# Patient Record
Sex: Male | Born: 1945
Health system: Southern US, Community
[De-identification: ages and names within clinical notes are randomized; demographics above are authoritative.]

---

## 2006-01-04 ENCOUNTER — Ambulatory Visit (HOSPITAL_COMMUNITY): Admission: RE | Admit: 2006-01-04 | Discharge: 2006-01-04 | Payer: Self-pay | Admitting: Neurosurgery

## 2006-02-01 ENCOUNTER — Ambulatory Visit (HOSPITAL_COMMUNITY): Admission: RE | Admit: 2006-02-01 | Discharge: 2006-02-01 | Payer: Self-pay | Admitting: Cardiology

## 2007-12-04 IMAGING — CR DG LUMBAR SPINE 2-3V
2 series · 2 of 2 positions shown · non-contrast
Comparison: None.

CLINICAL DATA: L4-5 microdiscectomy.   
 LUMBAR SPINE - 2 VIEW:

[view not recorded (1 of 2)]
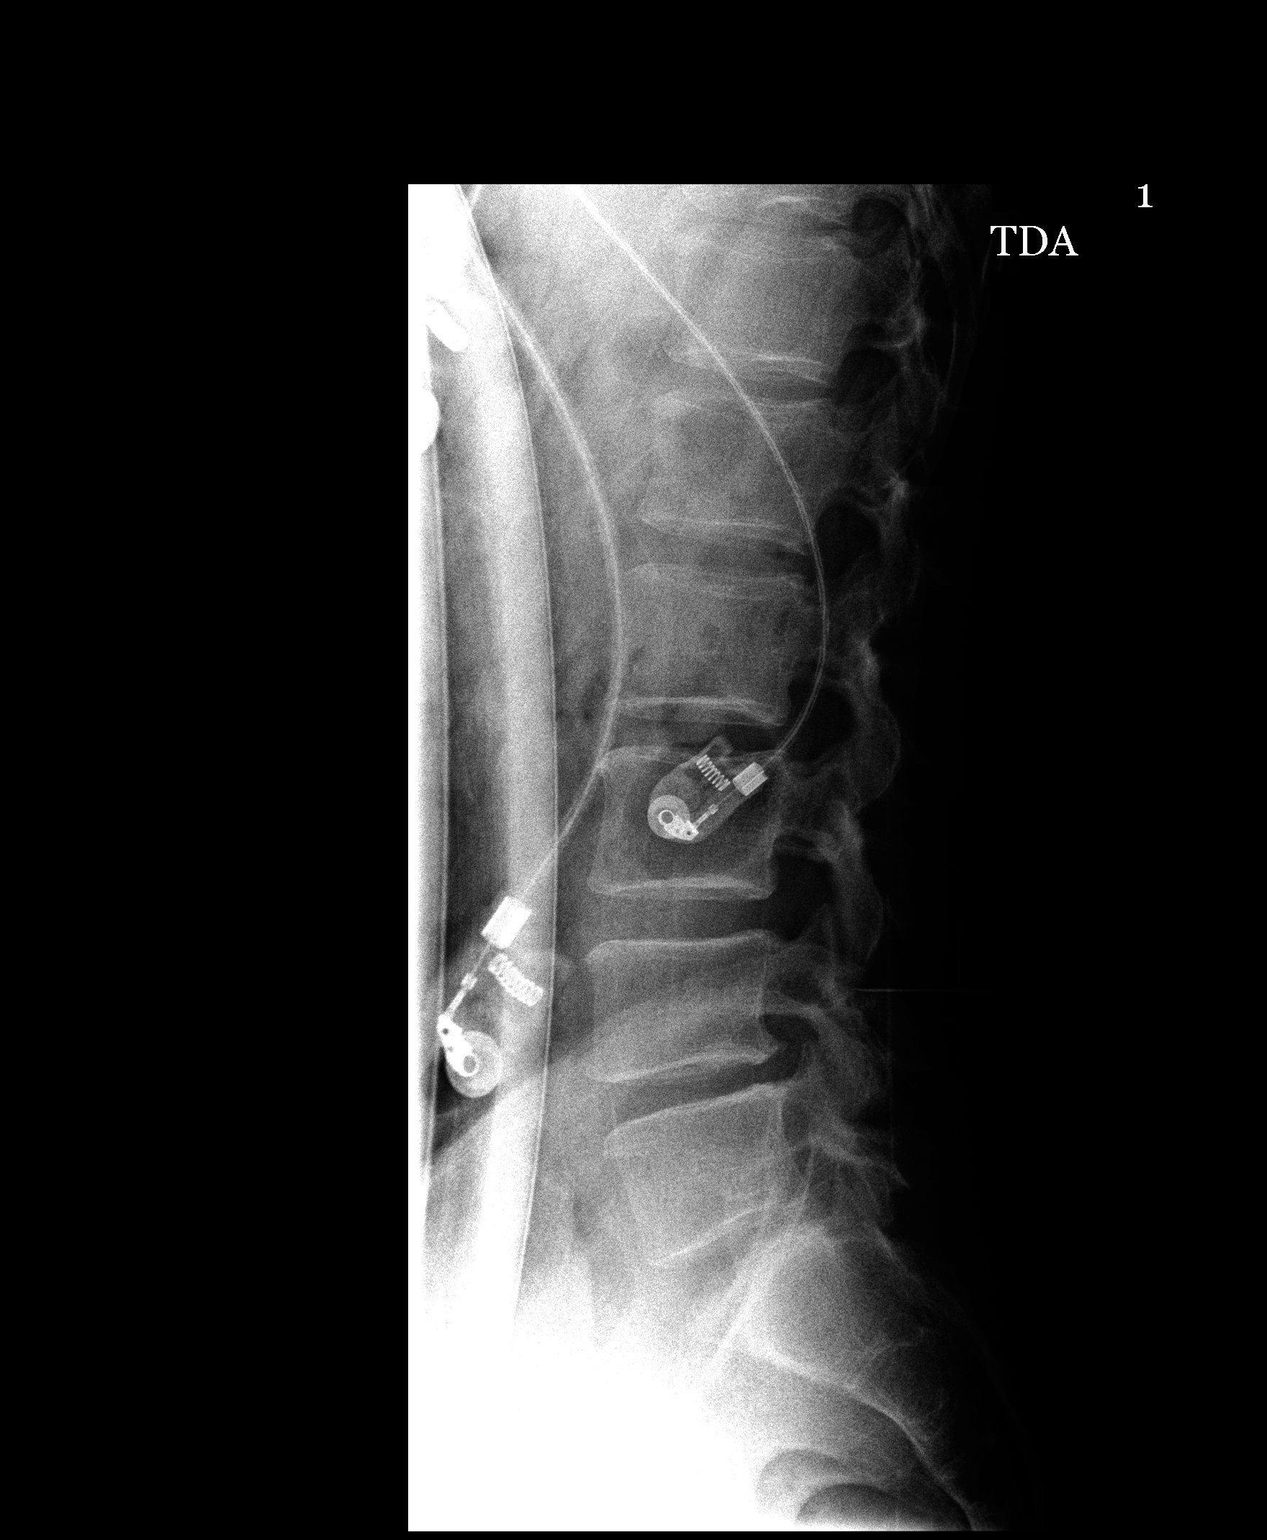

[view not recorded (2 of 2)]
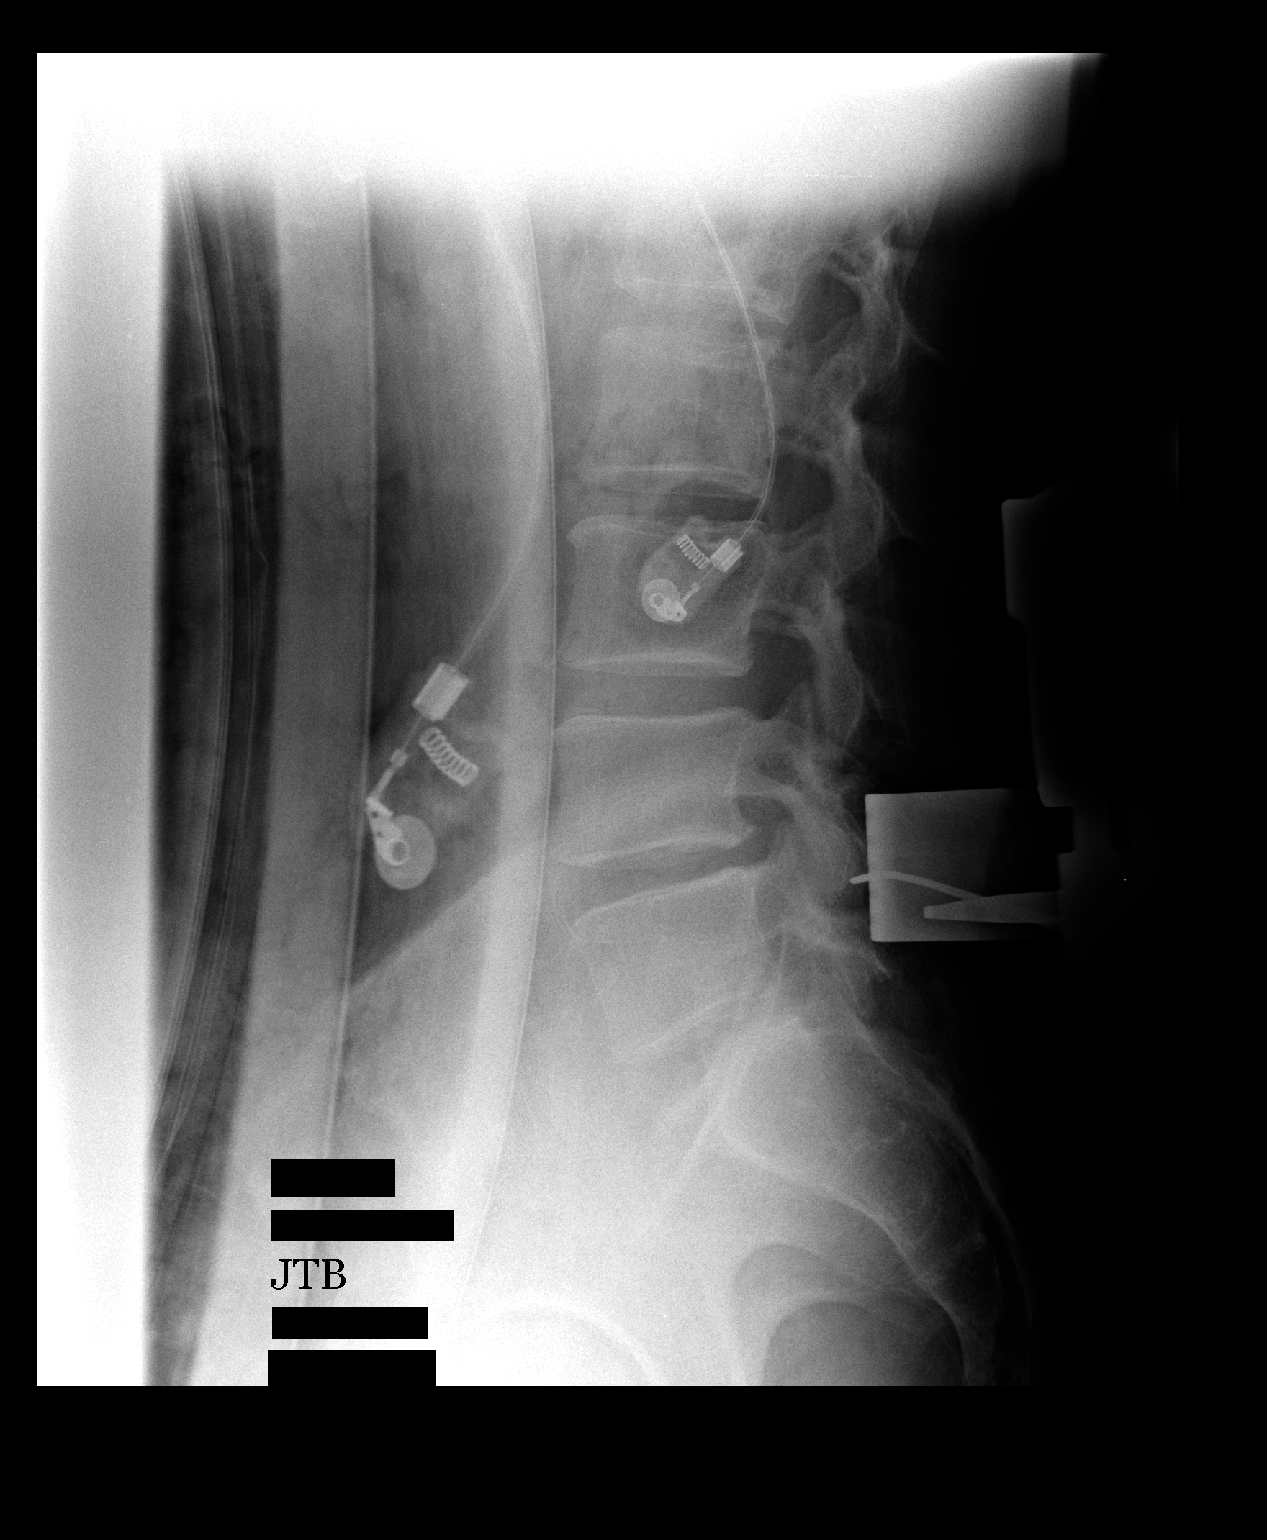

[2 of 2 positions shown; findings below may reference images not displayed]

FINDINGS: Two intraoperative cross table lateral views of the lumbar spine are submitted. The first film taken at 7454 hours shows surgical probe tip projecting posterior to the L3-4 facet joint.  The second film shows surgical probe tip projecting posterior to the L4-5 facet joint.  There are degenerative endplate changes.
IMPRESSION: As discussed above.

## 2015-02-17 DIAGNOSIS — J0100 Acute maxillary sinusitis, unspecified: Secondary | ICD-10-CM | POA: Diagnosis not present

## 2015-04-27 DIAGNOSIS — H6692 Otitis media, unspecified, left ear: Secondary | ICD-10-CM | POA: Diagnosis not present

## 2015-05-01 DIAGNOSIS — I255 Ischemic cardiomyopathy: Secondary | ICD-10-CM | POA: Diagnosis not present

## 2015-05-01 DIAGNOSIS — Z7982 Long term (current) use of aspirin: Secondary | ICD-10-CM | POA: Diagnosis not present

## 2015-05-01 DIAGNOSIS — I2510 Atherosclerotic heart disease of native coronary artery without angina pectoris: Secondary | ICD-10-CM | POA: Diagnosis not present

## 2015-05-01 DIAGNOSIS — E7800 Pure hypercholesterolemia, unspecified: Secondary | ICD-10-CM | POA: Diagnosis not present

## 2015-10-15 DIAGNOSIS — M5442 Lumbago with sciatica, left side: Secondary | ICD-10-CM | POA: Diagnosis not present

## 2015-10-15 DIAGNOSIS — M9904 Segmental and somatic dysfunction of sacral region: Secondary | ICD-10-CM | POA: Diagnosis not present

## 2015-10-15 DIAGNOSIS — M5136 Other intervertebral disc degeneration, lumbar region: Secondary | ICD-10-CM | POA: Diagnosis not present

## 2015-10-15 DIAGNOSIS — M9903 Segmental and somatic dysfunction of lumbar region: Secondary | ICD-10-CM | POA: Diagnosis not present

## 2015-11-05 DIAGNOSIS — E7800 Pure hypercholesterolemia, unspecified: Secondary | ICD-10-CM | POA: Diagnosis not present

## 2015-11-05 DIAGNOSIS — I255 Ischemic cardiomyopathy: Secondary | ICD-10-CM | POA: Diagnosis not present

## 2015-11-05 DIAGNOSIS — Z7982 Long term (current) use of aspirin: Secondary | ICD-10-CM | POA: Diagnosis not present

## 2015-11-05 DIAGNOSIS — I2510 Atherosclerotic heart disease of native coronary artery without angina pectoris: Secondary | ICD-10-CM | POA: Diagnosis not present

## 2015-11-05 DIAGNOSIS — I10 Essential (primary) hypertension: Secondary | ICD-10-CM | POA: Diagnosis not present

## 2015-11-12 DIAGNOSIS — I2510 Atherosclerotic heart disease of native coronary artery without angina pectoris: Secondary | ICD-10-CM | POA: Diagnosis not present

## 2015-11-12 DIAGNOSIS — I10 Essential (primary) hypertension: Secondary | ICD-10-CM | POA: Diagnosis not present

## 2015-11-12 DIAGNOSIS — I255 Ischemic cardiomyopathy: Secondary | ICD-10-CM | POA: Diagnosis not present

## 2015-11-12 DIAGNOSIS — E7800 Pure hypercholesterolemia, unspecified: Secondary | ICD-10-CM | POA: Diagnosis not present

## 2015-11-12 DIAGNOSIS — Z7982 Long term (current) use of aspirin: Secondary | ICD-10-CM | POA: Diagnosis not present

## 2016-01-22 DIAGNOSIS — Z23 Encounter for immunization: Secondary | ICD-10-CM | POA: Diagnosis not present

## 2016-04-13 DIAGNOSIS — J0100 Acute maxillary sinusitis, unspecified: Secondary | ICD-10-CM | POA: Diagnosis not present

## 2016-04-20 DIAGNOSIS — J0100 Acute maxillary sinusitis, unspecified: Secondary | ICD-10-CM | POA: Diagnosis not present

## 2016-05-28 DIAGNOSIS — H60539 Acute contact otitis externa, unspecified ear: Secondary | ICD-10-CM | POA: Diagnosis not present

## 2016-05-28 DIAGNOSIS — J0100 Acute maxillary sinusitis, unspecified: Secondary | ICD-10-CM | POA: Diagnosis not present

## 2016-06-06 DIAGNOSIS — R911 Solitary pulmonary nodule: Secondary | ICD-10-CM | POA: Diagnosis not present

## 2016-06-06 DIAGNOSIS — I252 Old myocardial infarction: Secondary | ICD-10-CM | POA: Diagnosis not present

## 2016-06-06 DIAGNOSIS — I2510 Atherosclerotic heart disease of native coronary artery without angina pectoris: Secondary | ICD-10-CM | POA: Diagnosis not present

## 2016-06-06 DIAGNOSIS — Z87891 Personal history of nicotine dependence: Secondary | ICD-10-CM | POA: Diagnosis not present

## 2016-06-06 DIAGNOSIS — R0602 Shortness of breath: Secondary | ICD-10-CM | POA: Diagnosis not present

## 2016-06-06 DIAGNOSIS — E785 Hyperlipidemia, unspecified: Secondary | ICD-10-CM | POA: Diagnosis not present

## 2016-06-06 DIAGNOSIS — I10 Essential (primary) hypertension: Secondary | ICD-10-CM | POA: Diagnosis not present

## 2016-06-06 DIAGNOSIS — R05 Cough: Secondary | ICD-10-CM | POA: Diagnosis not present

## 2016-06-08 DIAGNOSIS — I509 Heart failure, unspecified: Secondary | ICD-10-CM | POA: Diagnosis not present

## 2016-06-08 DIAGNOSIS — R0602 Shortness of breath: Secondary | ICD-10-CM | POA: Diagnosis not present

## 2016-06-08 DIAGNOSIS — R942 Abnormal results of pulmonary function studies: Secondary | ICD-10-CM | POA: Diagnosis not present

## 2016-06-08 DIAGNOSIS — R590 Localized enlarged lymph nodes: Secondary | ICD-10-CM | POA: Diagnosis not present

## 2016-06-15 DIAGNOSIS — I700 Atherosclerosis of aorta: Secondary | ICD-10-CM | POA: Diagnosis not present

## 2016-06-15 DIAGNOSIS — D862 Sarcoidosis of lung with sarcoidosis of lymph nodes: Secondary | ICD-10-CM | POA: Diagnosis not present

## 2016-06-15 DIAGNOSIS — R918 Other nonspecific abnormal finding of lung field: Secondary | ICD-10-CM | POA: Diagnosis not present

## 2016-06-15 DIAGNOSIS — J90 Pleural effusion, not elsewhere classified: Secondary | ICD-10-CM | POA: Diagnosis not present

## 2016-06-15 DIAGNOSIS — K5790 Diverticulosis of intestine, part unspecified, without perforation or abscess without bleeding: Secondary | ICD-10-CM | POA: Diagnosis not present

## 2016-06-15 DIAGNOSIS — K8020 Calculus of gallbladder without cholecystitis without obstruction: Secondary | ICD-10-CM | POA: Diagnosis not present

## 2016-06-15 DIAGNOSIS — R911 Solitary pulmonary nodule: Secondary | ICD-10-CM | POA: Diagnosis not present

## 2016-06-16 DIAGNOSIS — I2510 Atherosclerotic heart disease of native coronary artery without angina pectoris: Secondary | ICD-10-CM | POA: Diagnosis not present

## 2016-06-16 DIAGNOSIS — I509 Heart failure, unspecified: Secondary | ICD-10-CM | POA: Diagnosis not present

## 2016-06-16 DIAGNOSIS — R222 Localized swelling, mass and lump, trunk: Secondary | ICD-10-CM | POA: Diagnosis not present

## 2016-06-23 DIAGNOSIS — Z87891 Personal history of nicotine dependence: Secondary | ICD-10-CM | POA: Diagnosis not present

## 2016-06-23 DIAGNOSIS — K5790 Diverticulosis of intestine, part unspecified, without perforation or abscess without bleeding: Secondary | ICD-10-CM | POA: Diagnosis not present

## 2016-06-23 DIAGNOSIS — J189 Pneumonia, unspecified organism: Secondary | ICD-10-CM | POA: Diagnosis not present

## 2016-06-23 DIAGNOSIS — E785 Hyperlipidemia, unspecified: Secondary | ICD-10-CM | POA: Diagnosis not present

## 2016-06-23 DIAGNOSIS — I2510 Atherosclerotic heart disease of native coronary artery without angina pectoris: Secondary | ICD-10-CM | POA: Diagnosis not present

## 2016-06-23 DIAGNOSIS — I252 Old myocardial infarction: Secondary | ICD-10-CM | POA: Diagnosis not present

## 2016-06-23 DIAGNOSIS — I10 Essential (primary) hypertension: Secondary | ICD-10-CM | POA: Diagnosis not present

## 2016-06-23 DIAGNOSIS — I700 Atherosclerosis of aorta: Secondary | ICD-10-CM | POA: Diagnosis not present

## 2016-06-23 DIAGNOSIS — Z9889 Other specified postprocedural states: Secondary | ICD-10-CM | POA: Diagnosis not present

## 2016-06-23 DIAGNOSIS — K8020 Calculus of gallbladder without cholecystitis without obstruction: Secondary | ICD-10-CM | POA: Diagnosis not present

## 2016-06-23 DIAGNOSIS — R911 Solitary pulmonary nodule: Secondary | ICD-10-CM | POA: Diagnosis not present

## 2016-06-23 DIAGNOSIS — R591 Generalized enlarged lymph nodes: Secondary | ICD-10-CM | POA: Diagnosis not present

## 2016-06-23 DIAGNOSIS — R918 Other nonspecific abnormal finding of lung field: Secondary | ICD-10-CM | POA: Diagnosis not present

## 2016-06-23 DIAGNOSIS — R0602 Shortness of breath: Secondary | ICD-10-CM | POA: Diagnosis not present

## 2016-06-28 DIAGNOSIS — J189 Pneumonia, unspecified organism: Secondary | ICD-10-CM | POA: Diagnosis not present

## 2016-06-28 DIAGNOSIS — I509 Heart failure, unspecified: Secondary | ICD-10-CM | POA: Diagnosis not present

## 2016-06-28 DIAGNOSIS — R222 Localized swelling, mass and lump, trunk: Secondary | ICD-10-CM | POA: Diagnosis not present

## 2016-07-05 DIAGNOSIS — R938 Abnormal findings on diagnostic imaging of other specified body structures: Secondary | ICD-10-CM | POA: Diagnosis not present

## 2016-07-05 DIAGNOSIS — R0602 Shortness of breath: Secondary | ICD-10-CM | POA: Diagnosis not present

## 2016-08-12 DIAGNOSIS — M549 Dorsalgia, unspecified: Secondary | ICD-10-CM | POA: Diagnosis not present

## 2016-08-12 DIAGNOSIS — J9601 Acute respiratory failure with hypoxia: Secondary | ICD-10-CM | POA: Diagnosis not present

## 2016-08-12 DIAGNOSIS — I10 Essential (primary) hypertension: Secondary | ICD-10-CM | POA: Diagnosis not present

## 2016-08-12 DIAGNOSIS — I2510 Atherosclerotic heart disease of native coronary artery without angina pectoris: Secondary | ICD-10-CM | POA: Diagnosis not present

## 2016-08-12 DIAGNOSIS — M545 Low back pain: Secondary | ICD-10-CM | POA: Diagnosis not present

## 2016-08-12 DIAGNOSIS — Z87891 Personal history of nicotine dependence: Secondary | ICD-10-CM | POA: Diagnosis not present

## 2016-08-12 DIAGNOSIS — I272 Pulmonary hypertension, unspecified: Secondary | ICD-10-CM | POA: Diagnosis not present

## 2016-08-12 DIAGNOSIS — R0902 Hypoxemia: Secondary | ICD-10-CM | POA: Diagnosis not present

## 2016-08-12 DIAGNOSIS — I110 Hypertensive heart disease with heart failure: Secondary | ICD-10-CM | POA: Diagnosis not present

## 2016-08-12 DIAGNOSIS — I252 Old myocardial infarction: Secondary | ICD-10-CM | POA: Diagnosis not present

## 2016-08-12 DIAGNOSIS — J90 Pleural effusion, not elsewhere classified: Secondary | ICD-10-CM | POA: Diagnosis not present

## 2016-08-12 DIAGNOSIS — R634 Abnormal weight loss: Secondary | ICD-10-CM | POA: Diagnosis not present

## 2016-08-12 DIAGNOSIS — F17211 Nicotine dependence, cigarettes, in remission: Secondary | ICD-10-CM | POA: Diagnosis not present

## 2016-08-12 DIAGNOSIS — Z9119 Patient's noncompliance with other medical treatment and regimen: Secondary | ICD-10-CM | POA: Diagnosis not present

## 2016-08-12 DIAGNOSIS — M1990 Unspecified osteoarthritis, unspecified site: Secondary | ICD-10-CM | POA: Diagnosis not present

## 2016-08-12 DIAGNOSIS — E7800 Pure hypercholesterolemia, unspecified: Secondary | ICD-10-CM | POA: Diagnosis not present

## 2016-08-12 DIAGNOSIS — I255 Ischemic cardiomyopathy: Secondary | ICD-10-CM | POA: Diagnosis not present

## 2016-08-12 DIAGNOSIS — Z79899 Other long term (current) drug therapy: Secondary | ICD-10-CM | POA: Diagnosis not present

## 2016-08-12 DIAGNOSIS — R0609 Other forms of dyspnea: Secondary | ICD-10-CM | POA: Diagnosis not present

## 2016-08-12 DIAGNOSIS — Z7902 Long term (current) use of antithrombotics/antiplatelets: Secondary | ICD-10-CM | POA: Diagnosis not present

## 2016-08-12 DIAGNOSIS — Z6827 Body mass index (BMI) 27.0-27.9, adult: Secondary | ICD-10-CM | POA: Diagnosis not present

## 2016-08-12 DIAGNOSIS — R079 Chest pain, unspecified: Secondary | ICD-10-CM | POA: Diagnosis not present

## 2016-08-12 DIAGNOSIS — R0602 Shortness of breath: Secondary | ICD-10-CM | POA: Diagnosis not present

## 2016-08-12 DIAGNOSIS — E873 Alkalosis: Secondary | ICD-10-CM | POA: Diagnosis not present

## 2016-08-12 DIAGNOSIS — J449 Chronic obstructive pulmonary disease, unspecified: Secondary | ICD-10-CM | POA: Diagnosis not present

## 2016-08-12 DIAGNOSIS — R748 Abnormal levels of other serum enzymes: Secondary | ICD-10-CM | POA: Diagnosis not present

## 2016-08-12 DIAGNOSIS — I5023 Acute on chronic systolic (congestive) heart failure: Secondary | ICD-10-CM | POA: Diagnosis not present

## 2016-08-12 DIAGNOSIS — I447 Left bundle-branch block, unspecified: Secondary | ICD-10-CM | POA: Diagnosis not present

## 2016-08-12 DIAGNOSIS — I501 Left ventricular failure: Secondary | ICD-10-CM | POA: Diagnosis not present

## 2016-08-12 DIAGNOSIS — F1020 Alcohol dependence, uncomplicated: Secondary | ICD-10-CM | POA: Diagnosis not present

## 2016-08-13 DIAGNOSIS — J90 Pleural effusion, not elsewhere classified: Secondary | ICD-10-CM | POA: Diagnosis not present

## 2016-08-13 DIAGNOSIS — R911 Solitary pulmonary nodule: Secondary | ICD-10-CM | POA: Diagnosis not present

## 2016-08-13 DIAGNOSIS — I5023 Acute on chronic systolic (congestive) heart failure: Secondary | ICD-10-CM | POA: Diagnosis not present

## 2016-08-13 DIAGNOSIS — E7800 Pure hypercholesterolemia, unspecified: Secondary | ICD-10-CM | POA: Diagnosis not present

## 2016-08-13 DIAGNOSIS — J9601 Acute respiratory failure with hypoxia: Secondary | ICD-10-CM | POA: Diagnosis not present

## 2016-08-13 DIAGNOSIS — I255 Ischemic cardiomyopathy: Secondary | ICD-10-CM | POA: Diagnosis not present

## 2016-08-13 DIAGNOSIS — R748 Abnormal levels of other serum enzymes: Secondary | ICD-10-CM | POA: Diagnosis not present

## 2016-08-13 DIAGNOSIS — M545 Low back pain: Secondary | ICD-10-CM | POA: Diagnosis not present

## 2016-08-13 DIAGNOSIS — R0609 Other forms of dyspnea: Secondary | ICD-10-CM | POA: Diagnosis not present

## 2016-08-13 DIAGNOSIS — I501 Left ventricular failure: Secondary | ICD-10-CM | POA: Diagnosis not present

## 2016-08-14 DIAGNOSIS — J90 Pleural effusion, not elsewhere classified: Secondary | ICD-10-CM | POA: Diagnosis not present

## 2016-08-14 DIAGNOSIS — I255 Ischemic cardiomyopathy: Secondary | ICD-10-CM | POA: Diagnosis not present

## 2016-08-14 DIAGNOSIS — R748 Abnormal levels of other serum enzymes: Secondary | ICD-10-CM | POA: Diagnosis not present

## 2016-08-14 DIAGNOSIS — I501 Left ventricular failure: Secondary | ICD-10-CM | POA: Diagnosis not present

## 2016-08-14 DIAGNOSIS — R911 Solitary pulmonary nodule: Secondary | ICD-10-CM | POA: Diagnosis not present

## 2016-08-14 DIAGNOSIS — I5023 Acute on chronic systolic (congestive) heart failure: Secondary | ICD-10-CM | POA: Diagnosis not present

## 2016-08-14 DIAGNOSIS — J9601 Acute respiratory failure with hypoxia: Secondary | ICD-10-CM | POA: Diagnosis not present

## 2016-08-22 DIAGNOSIS — Z7982 Long term (current) use of aspirin: Secondary | ICD-10-CM | POA: Diagnosis not present

## 2016-08-22 DIAGNOSIS — E7800 Pure hypercholesterolemia, unspecified: Secondary | ICD-10-CM | POA: Diagnosis not present

## 2016-08-22 DIAGNOSIS — I255 Ischemic cardiomyopathy: Secondary | ICD-10-CM | POA: Diagnosis not present

## 2016-08-22 DIAGNOSIS — I10 Essential (primary) hypertension: Secondary | ICD-10-CM | POA: Diagnosis not present

## 2016-08-22 DIAGNOSIS — I2510 Atherosclerotic heart disease of native coronary artery without angina pectoris: Secondary | ICD-10-CM | POA: Diagnosis not present

## 2016-09-14 DIAGNOSIS — R1031 Right lower quadrant pain: Secondary | ICD-10-CM | POA: Diagnosis not present

## 2016-09-14 DIAGNOSIS — I252 Old myocardial infarction: Secondary | ICD-10-CM | POA: Diagnosis not present

## 2016-09-14 DIAGNOSIS — E785 Hyperlipidemia, unspecified: Secondary | ICD-10-CM | POA: Diagnosis not present

## 2016-09-14 DIAGNOSIS — I5023 Acute on chronic systolic (congestive) heart failure: Secondary | ICD-10-CM | POA: Diagnosis not present

## 2016-09-14 DIAGNOSIS — R509 Fever, unspecified: Secondary | ICD-10-CM | POA: Diagnosis not present

## 2016-09-14 DIAGNOSIS — Z87891 Personal history of nicotine dependence: Secondary | ICD-10-CM | POA: Diagnosis not present

## 2016-09-14 DIAGNOSIS — I2510 Atherosclerotic heart disease of native coronary artery without angina pectoris: Secondary | ICD-10-CM | POA: Diagnosis not present

## 2016-09-14 DIAGNOSIS — R110 Nausea: Secondary | ICD-10-CM | POA: Diagnosis not present

## 2016-09-14 DIAGNOSIS — R6883 Chills (without fever): Secondary | ICD-10-CM | POA: Diagnosis not present

## 2016-09-14 DIAGNOSIS — R61 Generalized hyperhidrosis: Secondary | ICD-10-CM | POA: Diagnosis not present

## 2016-09-14 DIAGNOSIS — R109 Unspecified abdominal pain: Secondary | ICD-10-CM | POA: Diagnosis not present

## 2016-09-14 DIAGNOSIS — I110 Hypertensive heart disease with heart failure: Secondary | ICD-10-CM | POA: Diagnosis not present

## 2016-10-03 DIAGNOSIS — I340 Nonrheumatic mitral (valve) insufficiency: Secondary | ICD-10-CM | POA: Diagnosis not present

## 2016-10-03 DIAGNOSIS — Z87891 Personal history of nicotine dependence: Secondary | ICD-10-CM | POA: Diagnosis not present

## 2016-10-03 DIAGNOSIS — R918 Other nonspecific abnormal finding of lung field: Secondary | ICD-10-CM | POA: Diagnosis not present

## 2016-10-03 DIAGNOSIS — I10 Essential (primary) hypertension: Secondary | ICD-10-CM | POA: Diagnosis not present

## 2016-10-03 DIAGNOSIS — I255 Ischemic cardiomyopathy: Secondary | ICD-10-CM | POA: Diagnosis not present

## 2016-10-03 DIAGNOSIS — I081 Rheumatic disorders of both mitral and tricuspid valves: Secondary | ICD-10-CM | POA: Diagnosis not present

## 2016-10-03 DIAGNOSIS — J181 Lobar pneumonia, unspecified organism: Secondary | ICD-10-CM | POA: Diagnosis not present

## 2016-10-03 DIAGNOSIS — I110 Hypertensive heart disease with heart failure: Secondary | ICD-10-CM | POA: Diagnosis not present

## 2016-10-03 DIAGNOSIS — I5023 Acute on chronic systolic (congestive) heart failure: Secondary | ICD-10-CM | POA: Diagnosis not present

## 2016-10-03 DIAGNOSIS — I509 Heart failure, unspecified: Secondary | ICD-10-CM | POA: Diagnosis not present

## 2016-10-03 DIAGNOSIS — I2510 Atherosclerotic heart disease of native coronary artery without angina pectoris: Secondary | ICD-10-CM | POA: Diagnosis not present

## 2016-10-03 DIAGNOSIS — J449 Chronic obstructive pulmonary disease, unspecified: Secondary | ICD-10-CM | POA: Diagnosis not present

## 2016-10-03 DIAGNOSIS — E785 Hyperlipidemia, unspecified: Secondary | ICD-10-CM | POA: Diagnosis not present

## 2016-10-03 DIAGNOSIS — J189 Pneumonia, unspecified organism: Secondary | ICD-10-CM | POA: Diagnosis not present

## 2016-10-03 DIAGNOSIS — I361 Nonrheumatic tricuspid (valve) insufficiency: Secondary | ICD-10-CM | POA: Diagnosis not present

## 2016-10-03 DIAGNOSIS — I252 Old myocardial infarction: Secondary | ICD-10-CM | POA: Diagnosis not present

## 2016-10-03 DIAGNOSIS — I272 Pulmonary hypertension, unspecified: Secondary | ICD-10-CM | POA: Diagnosis not present

## 2016-10-03 DIAGNOSIS — J9601 Acute respiratory failure with hypoxia: Secondary | ICD-10-CM | POA: Diagnosis not present

## 2016-10-03 DIAGNOSIS — E7800 Pure hypercholesterolemia, unspecified: Secondary | ICD-10-CM | POA: Diagnosis not present

## 2016-10-03 DIAGNOSIS — R911 Solitary pulmonary nodule: Secondary | ICD-10-CM | POA: Diagnosis not present

## 2016-10-03 DIAGNOSIS — J440 Chronic obstructive pulmonary disease with acute lower respiratory infection: Secondary | ICD-10-CM | POA: Diagnosis not present

## 2016-10-03 DIAGNOSIS — R0609 Other forms of dyspnea: Secondary | ICD-10-CM | POA: Diagnosis not present

## 2016-10-12 DIAGNOSIS — I255 Ischemic cardiomyopathy: Secondary | ICD-10-CM | POA: Diagnosis not present

## 2016-10-12 DIAGNOSIS — R911 Solitary pulmonary nodule: Secondary | ICD-10-CM | POA: Diagnosis not present

## 2016-10-12 DIAGNOSIS — I10 Essential (primary) hypertension: Secondary | ICD-10-CM | POA: Diagnosis not present

## 2016-10-13 DIAGNOSIS — I10 Essential (primary) hypertension: Secondary | ICD-10-CM | POA: Diagnosis not present

## 2016-10-13 DIAGNOSIS — Z87891 Personal history of nicotine dependence: Secondary | ICD-10-CM | POA: Diagnosis not present

## 2016-10-13 DIAGNOSIS — C3431 Malignant neoplasm of lower lobe, right bronchus or lung: Secondary | ICD-10-CM | POA: Diagnosis not present

## 2016-10-13 DIAGNOSIS — Z7902 Long term (current) use of antithrombotics/antiplatelets: Secondary | ICD-10-CM | POA: Diagnosis not present

## 2016-10-13 DIAGNOSIS — I2510 Atherosclerotic heart disease of native coronary artery without angina pectoris: Secondary | ICD-10-CM | POA: Diagnosis not present

## 2016-10-13 DIAGNOSIS — I5023 Acute on chronic systolic (congestive) heart failure: Secondary | ICD-10-CM | POA: Diagnosis not present

## 2016-10-13 DIAGNOSIS — R911 Solitary pulmonary nodule: Secondary | ICD-10-CM | POA: Diagnosis not present

## 2016-10-13 DIAGNOSIS — Z79899 Other long term (current) drug therapy: Secondary | ICD-10-CM | POA: Diagnosis not present

## 2016-10-14 ENCOUNTER — Other Ambulatory Visit: Payer: Self-pay

## 2016-10-14 NOTE — Patient Outreach (Signed)
[facility]) Care Management  10/14/2016  Brett Flynn 10-27-1945 [id number]  Transition of care  Referral date: 10/13/16 Referral source: Insurance referral  Attempt #1  Telephone call to patient regarding transition of care follow up.  HIPAA verified with patient. Discussed transition of care program with patient. Patient refused.  Patient states he is already in a transition follow up program. Patient states he sees a doctor at [facility]. Patient would not give doctor name.  Patient ended call.  RNCM contacted [facility] family physicians.  Office staff personal states patient has not been to practice since 2014.   PLAN:  RNCM will refer patient to care management assistant to close due to refusal of services. RNCM will send patients primary MD closure notification.   [name] RN,BSN,CCM [facility] Telephonic  [phone]

## 2016-10-19 DIAGNOSIS — I10 Essential (primary) hypertension: Secondary | ICD-10-CM | POA: Diagnosis not present

## 2016-10-20 DIAGNOSIS — I2510 Atherosclerotic heart disease of native coronary artery without angina pectoris: Secondary | ICD-10-CM | POA: Diagnosis not present

## 2016-10-20 DIAGNOSIS — E7800 Pure hypercholesterolemia, unspecified: Secondary | ICD-10-CM | POA: Diagnosis not present

## 2016-10-20 DIAGNOSIS — I10 Essential (primary) hypertension: Secondary | ICD-10-CM | POA: Diagnosis not present

## 2016-10-20 DIAGNOSIS — I255 Ischemic cardiomyopathy: Secondary | ICD-10-CM | POA: Diagnosis not present

## 2016-10-21 DIAGNOSIS — I5023 Acute on chronic systolic (congestive) heart failure: Secondary | ICD-10-CM | POA: Diagnosis not present

## 2016-10-21 DIAGNOSIS — J181 Lobar pneumonia, unspecified organism: Secondary | ICD-10-CM | POA: Diagnosis not present

## 2016-10-21 DIAGNOSIS — I501 Left ventricular failure: Secondary | ICD-10-CM | POA: Diagnosis not present

## 2016-10-21 DIAGNOSIS — C3491 Malignant neoplasm of unspecified part of right bronchus or lung: Secondary | ICD-10-CM | POA: Diagnosis not present

## 2016-10-26 DIAGNOSIS — C3490 Malignant neoplasm of unspecified part of unspecified bronchus or lung: Secondary | ICD-10-CM | POA: Diagnosis not present

## 2016-10-26 DIAGNOSIS — J90 Pleural effusion, not elsewhere classified: Secondary | ICD-10-CM | POA: Diagnosis not present

## 2016-10-26 DIAGNOSIS — C3491 Malignant neoplasm of unspecified part of right bronchus or lung: Secondary | ICD-10-CM | POA: Diagnosis not present

## 2016-11-03 DIAGNOSIS — I5020 Unspecified systolic (congestive) heart failure: Secondary | ICD-10-CM | POA: Diagnosis not present

## 2016-11-03 DIAGNOSIS — J449 Chronic obstructive pulmonary disease, unspecified: Secondary | ICD-10-CM | POA: Diagnosis not present

## 2016-11-03 DIAGNOSIS — I5023 Acute on chronic systolic (congestive) heart failure: Secondary | ICD-10-CM | POA: Diagnosis not present

## 2016-11-03 DIAGNOSIS — C3491 Malignant neoplasm of unspecified part of right bronchus or lung: Secondary | ICD-10-CM | POA: Diagnosis not present

## 2016-11-03 DIAGNOSIS — Z87891 Personal history of nicotine dependence: Secondary | ICD-10-CM | POA: Diagnosis not present

## 2016-11-03 DIAGNOSIS — C3431 Malignant neoplasm of lower lobe, right bronchus or lung: Secondary | ICD-10-CM | POA: Diagnosis not present

## 2016-11-03 DIAGNOSIS — I2510 Atherosclerotic heart disease of native coronary artery without angina pectoris: Secondary | ICD-10-CM | POA: Diagnosis not present

## 2016-11-03 DIAGNOSIS — I110 Hypertensive heart disease with heart failure: Secondary | ICD-10-CM | POA: Diagnosis not present

## 2016-11-06 DIAGNOSIS — Z01812 Encounter for preprocedural laboratory examination: Secondary | ICD-10-CM | POA: Diagnosis not present

## 2016-11-07 DIAGNOSIS — C3490 Malignant neoplasm of unspecified part of unspecified bronchus or lung: Secondary | ICD-10-CM | POA: Diagnosis not present

## 2016-11-07 DIAGNOSIS — C3491 Malignant neoplasm of unspecified part of right bronchus or lung: Secondary | ICD-10-CM | POA: Diagnosis not present

## 2016-11-08 DIAGNOSIS — C3491 Malignant neoplasm of unspecified part of right bronchus or lung: Secondary | ICD-10-CM | POA: Diagnosis not present

## 2016-11-16 DIAGNOSIS — M533 Sacrococcygeal disorders, not elsewhere classified: Secondary | ICD-10-CM | POA: Diagnosis not present

## 2016-11-16 DIAGNOSIS — G8929 Other chronic pain: Secondary | ICD-10-CM | POA: Diagnosis not present

## 2016-11-16 DIAGNOSIS — M545 Low back pain: Secondary | ICD-10-CM | POA: Diagnosis not present

## 2016-11-16 DIAGNOSIS — C3491 Malignant neoplasm of unspecified part of right bronchus or lung: Secondary | ICD-10-CM | POA: Diagnosis not present

## 2016-11-16 DIAGNOSIS — Z7689 Persons encountering health services in other specified circumstances: Secondary | ICD-10-CM | POA: Diagnosis not present

## 2016-11-16 DIAGNOSIS — I10 Essential (primary) hypertension: Secondary | ICD-10-CM | POA: Diagnosis not present

## 2016-11-17 DIAGNOSIS — C3491 Malignant neoplasm of unspecified part of right bronchus or lung: Secondary | ICD-10-CM | POA: Diagnosis not present

## 2016-11-23 DIAGNOSIS — I255 Ischemic cardiomyopathy: Secondary | ICD-10-CM | POA: Diagnosis not present

## 2016-11-23 DIAGNOSIS — I272 Pulmonary hypertension, unspecified: Secondary | ICD-10-CM | POA: Diagnosis not present

## 2016-11-23 DIAGNOSIS — Z7902 Long term (current) use of antithrombotics/antiplatelets: Secondary | ICD-10-CM | POA: Diagnosis not present

## 2016-11-23 DIAGNOSIS — I252 Old myocardial infarction: Secondary | ICD-10-CM | POA: Diagnosis not present

## 2016-11-23 DIAGNOSIS — C3491 Malignant neoplasm of unspecified part of right bronchus or lung: Secondary | ICD-10-CM | POA: Diagnosis not present

## 2016-11-23 DIAGNOSIS — C771 Secondary and unspecified malignant neoplasm of intrathoracic lymph nodes: Secondary | ICD-10-CM | POA: Diagnosis not present

## 2016-11-23 DIAGNOSIS — Z79899 Other long term (current) drug therapy: Secondary | ICD-10-CM | POA: Diagnosis not present

## 2016-11-23 DIAGNOSIS — Z87891 Personal history of nicotine dependence: Secondary | ICD-10-CM | POA: Diagnosis not present

## 2016-11-23 DIAGNOSIS — I2510 Atherosclerotic heart disease of native coronary artery without angina pectoris: Secondary | ICD-10-CM | POA: Diagnosis not present

## 2016-11-23 DIAGNOSIS — Z955 Presence of coronary angioplasty implant and graft: Secondary | ICD-10-CM | POA: Diagnosis not present

## 2016-11-23 DIAGNOSIS — I110 Hypertensive heart disease with heart failure: Secondary | ICD-10-CM | POA: Diagnosis not present

## 2016-11-23 DIAGNOSIS — J449 Chronic obstructive pulmonary disease, unspecified: Secondary | ICD-10-CM | POA: Diagnosis not present

## 2016-11-23 DIAGNOSIS — R590 Localized enlarged lymph nodes: Secondary | ICD-10-CM | POA: Diagnosis not present

## 2016-11-23 DIAGNOSIS — E7800 Pure hypercholesterolemia, unspecified: Secondary | ICD-10-CM | POA: Diagnosis not present

## 2016-11-23 DIAGNOSIS — I509 Heart failure, unspecified: Secondary | ICD-10-CM | POA: Diagnosis not present

## 2016-11-23 DIAGNOSIS — Z7951 Long term (current) use of inhaled steroids: Secondary | ICD-10-CM | POA: Diagnosis not present

## 2016-11-23 DIAGNOSIS — J9601 Acute respiratory failure with hypoxia: Secondary | ICD-10-CM | POA: Diagnosis not present

## 2016-11-29 DIAGNOSIS — C3431 Malignant neoplasm of lower lobe, right bronchus or lung: Secondary | ICD-10-CM | POA: Diagnosis not present

## 2016-11-29 DIAGNOSIS — C3491 Malignant neoplasm of unspecified part of right bronchus or lung: Secondary | ICD-10-CM | POA: Diagnosis not present

## 2016-12-01 DIAGNOSIS — Z5111 Encounter for antineoplastic chemotherapy: Secondary | ICD-10-CM | POA: Diagnosis not present

## 2016-12-01 DIAGNOSIS — J449 Chronic obstructive pulmonary disease, unspecified: Secondary | ICD-10-CM | POA: Diagnosis not present

## 2016-12-01 DIAGNOSIS — C3431 Malignant neoplasm of lower lobe, right bronchus or lung: Secondary | ICD-10-CM | POA: Diagnosis not present

## 2016-12-01 DIAGNOSIS — C3491 Malignant neoplasm of unspecified part of right bronchus or lung: Secondary | ICD-10-CM | POA: Diagnosis not present

## 2016-12-01 DIAGNOSIS — I255 Ischemic cardiomyopathy: Secondary | ICD-10-CM | POA: Diagnosis not present

## 2016-12-01 DIAGNOSIS — Z87891 Personal history of nicotine dependence: Secondary | ICD-10-CM | POA: Diagnosis not present

## 2016-12-01 DIAGNOSIS — R930 Abnormal findings on diagnostic imaging of skull and head, not elsewhere classified: Secondary | ICD-10-CM | POA: Diagnosis not present

## 2016-12-01 DIAGNOSIS — I2510 Atherosclerotic heart disease of native coronary artery without angina pectoris: Secondary | ICD-10-CM | POA: Diagnosis not present

## 2016-12-02 DIAGNOSIS — J90 Pleural effusion, not elsewhere classified: Secondary | ICD-10-CM | POA: Diagnosis not present

## 2016-12-02 DIAGNOSIS — I501 Left ventricular failure: Secondary | ICD-10-CM | POA: Diagnosis not present

## 2016-12-02 DIAGNOSIS — C3491 Malignant neoplasm of unspecified part of right bronchus or lung: Secondary | ICD-10-CM | POA: Diagnosis not present

## 2016-12-06 DIAGNOSIS — I501 Left ventricular failure: Secondary | ICD-10-CM | POA: Diagnosis not present

## 2016-12-06 DIAGNOSIS — Z87891 Personal history of nicotine dependence: Secondary | ICD-10-CM | POA: Diagnosis not present

## 2016-12-06 DIAGNOSIS — C3431 Malignant neoplasm of lower lobe, right bronchus or lung: Secondary | ICD-10-CM | POA: Diagnosis not present

## 2016-12-06 DIAGNOSIS — Z5111 Encounter for antineoplastic chemotherapy: Secondary | ICD-10-CM | POA: Diagnosis not present

## 2016-12-06 DIAGNOSIS — Z5112 Encounter for antineoplastic immunotherapy: Secondary | ICD-10-CM | POA: Diagnosis not present

## 2016-12-08 DIAGNOSIS — C3431 Malignant neoplasm of lower lobe, right bronchus or lung: Secondary | ICD-10-CM | POA: Diagnosis not present

## 2016-12-09 DIAGNOSIS — C3491 Malignant neoplasm of unspecified part of right bronchus or lung: Secondary | ICD-10-CM | POA: Diagnosis not present

## 2016-12-12 DIAGNOSIS — Z5111 Encounter for antineoplastic chemotherapy: Secondary | ICD-10-CM | POA: Diagnosis not present

## 2016-12-12 DIAGNOSIS — C3431 Malignant neoplasm of lower lobe, right bronchus or lung: Secondary | ICD-10-CM | POA: Diagnosis not present

## 2016-12-12 DIAGNOSIS — Z510 Encounter for antineoplastic radiation therapy: Secondary | ICD-10-CM | POA: Diagnosis not present

## 2016-12-12 DIAGNOSIS — C3491 Malignant neoplasm of unspecified part of right bronchus or lung: Secondary | ICD-10-CM | POA: Diagnosis not present

## 2016-12-13 DIAGNOSIS — C3431 Malignant neoplasm of lower lobe, right bronchus or lung: Secondary | ICD-10-CM | POA: Diagnosis not present

## 2016-12-14 DIAGNOSIS — C3431 Malignant neoplasm of lower lobe, right bronchus or lung: Secondary | ICD-10-CM | POA: Diagnosis not present

## 2016-12-15 DIAGNOSIS — C3491 Malignant neoplasm of unspecified part of right bronchus or lung: Secondary | ICD-10-CM | POA: Diagnosis not present

## 2016-12-15 DIAGNOSIS — C3431 Malignant neoplasm of lower lobe, right bronchus or lung: Secondary | ICD-10-CM | POA: Diagnosis not present

## 2016-12-16 DIAGNOSIS — Z87891 Personal history of nicotine dependence: Secondary | ICD-10-CM | POA: Diagnosis not present

## 2016-12-16 DIAGNOSIS — J449 Chronic obstructive pulmonary disease, unspecified: Secondary | ICD-10-CM | POA: Diagnosis not present

## 2016-12-16 DIAGNOSIS — I255 Ischemic cardiomyopathy: Secondary | ICD-10-CM | POA: Diagnosis not present

## 2016-12-16 DIAGNOSIS — C3431 Malignant neoplasm of lower lobe, right bronchus or lung: Secondary | ICD-10-CM | POA: Diagnosis not present

## 2016-12-16 DIAGNOSIS — I2510 Atherosclerotic heart disease of native coronary artery without angina pectoris: Secondary | ICD-10-CM | POA: Diagnosis not present

## 2016-12-16 DIAGNOSIS — Z5111 Encounter for antineoplastic chemotherapy: Secondary | ICD-10-CM | POA: Diagnosis not present

## 2016-12-16 DIAGNOSIS — C3491 Malignant neoplasm of unspecified part of right bronchus or lung: Secondary | ICD-10-CM | POA: Diagnosis not present

## 2016-12-16 DIAGNOSIS — Z5112 Encounter for antineoplastic immunotherapy: Secondary | ICD-10-CM | POA: Diagnosis not present

## 2016-12-19 DIAGNOSIS — C3491 Malignant neoplasm of unspecified part of right bronchus or lung: Secondary | ICD-10-CM | POA: Diagnosis not present

## 2016-12-19 DIAGNOSIS — Z5111 Encounter for antineoplastic chemotherapy: Secondary | ICD-10-CM | POA: Diagnosis not present

## 2016-12-19 DIAGNOSIS — C3431 Malignant neoplasm of lower lobe, right bronchus or lung: Secondary | ICD-10-CM | POA: Diagnosis not present

## 2016-12-20 DIAGNOSIS — C3431 Malignant neoplasm of lower lobe, right bronchus or lung: Secondary | ICD-10-CM | POA: Diagnosis not present

## 2016-12-21 DIAGNOSIS — C3431 Malignant neoplasm of lower lobe, right bronchus or lung: Secondary | ICD-10-CM | POA: Diagnosis not present

## 2016-12-22 DIAGNOSIS — C3431 Malignant neoplasm of lower lobe, right bronchus or lung: Secondary | ICD-10-CM | POA: Diagnosis not present

## 2016-12-23 DIAGNOSIS — C3490 Malignant neoplasm of unspecified part of unspecified bronchus or lung: Secondary | ICD-10-CM | POA: Diagnosis not present

## 2016-12-23 DIAGNOSIS — Z510 Encounter for antineoplastic radiation therapy: Secondary | ICD-10-CM | POA: Diagnosis not present

## 2016-12-23 DIAGNOSIS — C3491 Malignant neoplasm of unspecified part of right bronchus or lung: Secondary | ICD-10-CM | POA: Diagnosis not present

## 2016-12-23 DIAGNOSIS — C3431 Malignant neoplasm of lower lobe, right bronchus or lung: Secondary | ICD-10-CM | POA: Diagnosis not present

## 2016-12-26 DIAGNOSIS — Z5111 Encounter for antineoplastic chemotherapy: Secondary | ICD-10-CM | POA: Diagnosis not present

## 2016-12-26 DIAGNOSIS — C3491 Malignant neoplasm of unspecified part of right bronchus or lung: Secondary | ICD-10-CM | POA: Diagnosis not present

## 2016-12-26 DIAGNOSIS — C3431 Malignant neoplasm of lower lobe, right bronchus or lung: Secondary | ICD-10-CM | POA: Diagnosis not present

## 2016-12-27 DIAGNOSIS — C3431 Malignant neoplasm of lower lobe, right bronchus or lung: Secondary | ICD-10-CM | POA: Diagnosis not present

## 2016-12-30 DIAGNOSIS — C3431 Malignant neoplasm of lower lobe, right bronchus or lung: Secondary | ICD-10-CM | POA: Diagnosis not present

## 2016-12-30 DIAGNOSIS — Z510 Encounter for antineoplastic radiation therapy: Secondary | ICD-10-CM | POA: Diagnosis not present

## 2017-01-02 DIAGNOSIS — C3491 Malignant neoplasm of unspecified part of right bronchus or lung: Secondary | ICD-10-CM | POA: Diagnosis not present

## 2017-01-02 DIAGNOSIS — I110 Hypertensive heart disease with heart failure: Secondary | ICD-10-CM | POA: Diagnosis not present

## 2017-01-02 DIAGNOSIS — J449 Chronic obstructive pulmonary disease, unspecified: Secondary | ICD-10-CM | POA: Diagnosis not present

## 2017-01-02 DIAGNOSIS — Z5111 Encounter for antineoplastic chemotherapy: Secondary | ICD-10-CM | POA: Diagnosis not present

## 2017-01-02 DIAGNOSIS — I5020 Unspecified systolic (congestive) heart failure: Secondary | ICD-10-CM | POA: Diagnosis not present

## 2017-01-02 DIAGNOSIS — C3431 Malignant neoplasm of lower lobe, right bronchus or lung: Secondary | ICD-10-CM | POA: Diagnosis not present

## 2017-01-02 DIAGNOSIS — Z87891 Personal history of nicotine dependence: Secondary | ICD-10-CM | POA: Diagnosis not present

## 2017-01-02 DIAGNOSIS — I2510 Atherosclerotic heart disease of native coronary artery without angina pectoris: Secondary | ICD-10-CM | POA: Diagnosis not present

## 2017-01-02 DIAGNOSIS — J90 Pleural effusion, not elsewhere classified: Secondary | ICD-10-CM | POA: Diagnosis not present

## 2017-01-03 DIAGNOSIS — C3431 Malignant neoplasm of lower lobe, right bronchus or lung: Secondary | ICD-10-CM | POA: Diagnosis not present

## 2017-01-04 DIAGNOSIS — Z510 Encounter for antineoplastic radiation therapy: Secondary | ICD-10-CM | POA: Diagnosis not present

## 2017-01-04 DIAGNOSIS — C3431 Malignant neoplasm of lower lobe, right bronchus or lung: Secondary | ICD-10-CM | POA: Diagnosis not present

## 2017-01-06 DIAGNOSIS — G8929 Other chronic pain: Secondary | ICD-10-CM | POA: Diagnosis not present

## 2017-01-06 DIAGNOSIS — M533 Sacrococcygeal disorders, not elsewhere classified: Secondary | ICD-10-CM | POA: Diagnosis not present

## 2017-01-06 DIAGNOSIS — M5136 Other intervertebral disc degeneration, lumbar region: Secondary | ICD-10-CM | POA: Diagnosis not present

## 2017-01-06 DIAGNOSIS — M545 Low back pain: Secondary | ICD-10-CM | POA: Diagnosis not present

## 2017-01-09 DIAGNOSIS — Z5111 Encounter for antineoplastic chemotherapy: Secondary | ICD-10-CM | POA: Diagnosis not present

## 2017-01-09 DIAGNOSIS — C3431 Malignant neoplasm of lower lobe, right bronchus or lung: Secondary | ICD-10-CM | POA: Diagnosis not present

## 2017-01-09 DIAGNOSIS — C3491 Malignant neoplasm of unspecified part of right bronchus or lung: Secondary | ICD-10-CM | POA: Diagnosis not present

## 2017-01-10 DIAGNOSIS — C3431 Malignant neoplasm of lower lobe, right bronchus or lung: Secondary | ICD-10-CM | POA: Diagnosis not present

## 2017-01-11 DIAGNOSIS — C3431 Malignant neoplasm of lower lobe, right bronchus or lung: Secondary | ICD-10-CM | POA: Diagnosis not present

## 2017-01-12 DIAGNOSIS — C3431 Malignant neoplasm of lower lobe, right bronchus or lung: Secondary | ICD-10-CM | POA: Diagnosis not present

## 2017-01-13 DIAGNOSIS — C3431 Malignant neoplasm of lower lobe, right bronchus or lung: Secondary | ICD-10-CM | POA: Diagnosis not present

## 2017-01-16 DIAGNOSIS — Z5111 Encounter for antineoplastic chemotherapy: Secondary | ICD-10-CM | POA: Diagnosis not present

## 2017-01-16 DIAGNOSIS — C3491 Malignant neoplasm of unspecified part of right bronchus or lung: Secondary | ICD-10-CM | POA: Diagnosis not present

## 2017-01-16 DIAGNOSIS — C3431 Malignant neoplasm of lower lobe, right bronchus or lung: Secondary | ICD-10-CM | POA: Diagnosis not present

## 2017-01-16 DIAGNOSIS — Z510 Encounter for antineoplastic radiation therapy: Secondary | ICD-10-CM | POA: Diagnosis not present

## 2017-01-17 DIAGNOSIS — C3431 Malignant neoplasm of lower lobe, right bronchus or lung: Secondary | ICD-10-CM | POA: Diagnosis not present

## 2017-01-18 DIAGNOSIS — C3431 Malignant neoplasm of lower lobe, right bronchus or lung: Secondary | ICD-10-CM | POA: Diagnosis not present

## 2017-01-19 DIAGNOSIS — C3431 Malignant neoplasm of lower lobe, right bronchus or lung: Secondary | ICD-10-CM | POA: Diagnosis not present

## 2017-01-20 DIAGNOSIS — C3431 Malignant neoplasm of lower lobe, right bronchus or lung: Secondary | ICD-10-CM | POA: Diagnosis not present

## 2017-01-20 DIAGNOSIS — Z510 Encounter for antineoplastic radiation therapy: Secondary | ICD-10-CM | POA: Diagnosis not present

## 2017-01-24 DIAGNOSIS — C3431 Malignant neoplasm of lower lobe, right bronchus or lung: Secondary | ICD-10-CM | POA: Diagnosis not present

## 2017-01-25 DIAGNOSIS — C3431 Malignant neoplasm of lower lobe, right bronchus or lung: Secondary | ICD-10-CM | POA: Diagnosis not present

## 2017-01-26 DIAGNOSIS — C3431 Malignant neoplasm of lower lobe, right bronchus or lung: Secondary | ICD-10-CM | POA: Diagnosis not present

## 2017-01-27 DIAGNOSIS — C3431 Malignant neoplasm of lower lobe, right bronchus or lung: Secondary | ICD-10-CM | POA: Diagnosis not present

## 2017-01-30 DIAGNOSIS — C3491 Malignant neoplasm of unspecified part of right bronchus or lung: Secondary | ICD-10-CM | POA: Diagnosis not present

## 2017-01-30 DIAGNOSIS — C3431 Malignant neoplasm of lower lobe, right bronchus or lung: Secondary | ICD-10-CM | POA: Diagnosis not present

## 2017-01-30 DIAGNOSIS — Z510 Encounter for antineoplastic radiation therapy: Secondary | ICD-10-CM | POA: Diagnosis not present

## 2017-02-04 DIAGNOSIS — C3491 Malignant neoplasm of unspecified part of right bronchus or lung: Secondary | ICD-10-CM | POA: Diagnosis not present

## 2017-02-04 DIAGNOSIS — R9431 Abnormal electrocardiogram [ECG] [EKG]: Secondary | ICD-10-CM | POA: Diagnosis not present

## 2017-02-04 DIAGNOSIS — J449 Chronic obstructive pulmonary disease, unspecified: Secondary | ICD-10-CM | POA: Diagnosis not present

## 2017-02-04 DIAGNOSIS — J90 Pleural effusion, not elsewhere classified: Secondary | ICD-10-CM | POA: Diagnosis not present

## 2017-02-04 DIAGNOSIS — I2510 Atherosclerotic heart disease of native coronary artery without angina pectoris: Secondary | ICD-10-CM | POA: Diagnosis not present

## 2017-02-04 DIAGNOSIS — I493 Ventricular premature depolarization: Secondary | ICD-10-CM | POA: Diagnosis not present

## 2017-02-04 DIAGNOSIS — R000 Tachycardia, unspecified: Secondary | ICD-10-CM | POA: Diagnosis not present

## 2017-02-04 DIAGNOSIS — Z87891 Personal history of nicotine dependence: Secondary | ICD-10-CM | POA: Diagnosis not present

## 2017-02-04 DIAGNOSIS — E7800 Pure hypercholesterolemia, unspecified: Secondary | ICD-10-CM | POA: Diagnosis not present

## 2017-02-04 DIAGNOSIS — R0602 Shortness of breath: Secondary | ICD-10-CM | POA: Diagnosis not present

## 2017-02-04 DIAGNOSIS — C3431 Malignant neoplasm of lower lobe, right bronchus or lung: Secondary | ICD-10-CM | POA: Diagnosis not present

## 2017-02-04 DIAGNOSIS — I5022 Chronic systolic (congestive) heart failure: Secondary | ICD-10-CM | POA: Diagnosis not present

## 2017-02-04 DIAGNOSIS — Z7902 Long term (current) use of antithrombotics/antiplatelets: Secondary | ICD-10-CM | POA: Diagnosis not present

## 2017-02-04 DIAGNOSIS — I255 Ischemic cardiomyopathy: Secondary | ICD-10-CM | POA: Diagnosis not present

## 2017-02-04 DIAGNOSIS — R531 Weakness: Secondary | ICD-10-CM | POA: Diagnosis not present

## 2017-02-04 DIAGNOSIS — I10 Essential (primary) hypertension: Secondary | ICD-10-CM | POA: Diagnosis not present

## 2017-02-04 DIAGNOSIS — Z85118 Personal history of other malignant neoplasm of bronchus and lung: Secondary | ICD-10-CM | POA: Diagnosis not present

## 2017-02-04 DIAGNOSIS — I252 Old myocardial infarction: Secondary | ICD-10-CM | POA: Diagnosis not present

## 2017-02-04 DIAGNOSIS — I447 Left bundle-branch block, unspecified: Secondary | ICD-10-CM | POA: Diagnosis not present

## 2017-02-04 DIAGNOSIS — R0600 Dyspnea, unspecified: Secondary | ICD-10-CM | POA: Diagnosis not present

## 2017-02-04 DIAGNOSIS — R748 Abnormal levels of other serum enzymes: Secondary | ICD-10-CM | POA: Diagnosis not present

## 2017-02-04 DIAGNOSIS — Z955 Presence of coronary angioplasty implant and graft: Secondary | ICD-10-CM | POA: Diagnosis not present

## 2017-02-04 DIAGNOSIS — I509 Heart failure, unspecified: Secondary | ICD-10-CM | POA: Diagnosis not present

## 2017-02-04 DIAGNOSIS — Z79899 Other long term (current) drug therapy: Secondary | ICD-10-CM | POA: Diagnosis not present

## 2017-02-04 DIAGNOSIS — R7989 Other specified abnormal findings of blood chemistry: Secondary | ICD-10-CM | POA: Diagnosis not present

## 2017-02-04 DIAGNOSIS — I110 Hypertensive heart disease with heart failure: Secondary | ICD-10-CM | POA: Diagnosis not present

## 2017-02-04 DIAGNOSIS — Z825 Family history of asthma and other chronic lower respiratory diseases: Secondary | ICD-10-CM | POA: Diagnosis not present

## 2017-02-04 DIAGNOSIS — D649 Anemia, unspecified: Secondary | ICD-10-CM | POA: Diagnosis not present

## 2017-02-04 DIAGNOSIS — R918 Other nonspecific abnormal finding of lung field: Secondary | ICD-10-CM | POA: Diagnosis not present

## 2017-02-05 DIAGNOSIS — R0600 Dyspnea, unspecified: Secondary | ICD-10-CM | POA: Diagnosis not present

## 2017-02-05 DIAGNOSIS — C3491 Malignant neoplasm of unspecified part of right bronchus or lung: Secondary | ICD-10-CM | POA: Diagnosis not present

## 2017-02-05 DIAGNOSIS — I509 Heart failure, unspecified: Secondary | ICD-10-CM | POA: Diagnosis not present

## 2017-02-05 DIAGNOSIS — R0602 Shortness of breath: Secondary | ICD-10-CM | POA: Diagnosis not present

## 2017-02-05 DIAGNOSIS — I110 Hypertensive heart disease with heart failure: Secondary | ICD-10-CM | POA: Diagnosis not present

## 2017-02-05 DIAGNOSIS — I447 Left bundle-branch block, unspecified: Secondary | ICD-10-CM | POA: Diagnosis not present

## 2017-02-05 DIAGNOSIS — R000 Tachycardia, unspecified: Secondary | ICD-10-CM | POA: Diagnosis not present

## 2017-02-05 DIAGNOSIS — I493 Ventricular premature depolarization: Secondary | ICD-10-CM | POA: Diagnosis not present

## 2017-02-05 DIAGNOSIS — J90 Pleural effusion, not elsewhere classified: Secondary | ICD-10-CM | POA: Diagnosis not present

## 2017-02-06 DIAGNOSIS — R918 Other nonspecific abnormal finding of lung field: Secondary | ICD-10-CM | POA: Diagnosis not present

## 2017-02-10 ENCOUNTER — Other Ambulatory Visit: Payer: Self-pay | Admitting: *Deleted

## 2017-02-10 NOTE — Patient Outreach (Signed)
[facility]) Care Management  02/10/2017  JAVEON MACMURRAY 1945/07/04 [id number]   Outreach attempt #1 to patient.  Patient was unavailable and he requested a return phone call at a later time.   Plan: RN CM will contact patient within one week.   Lake Bells, RN, BSN, MHA/MSL, [place] Telephonic Care Manager Coordinator [facility] Direct Phone: [phone] Cell Phone: [phone] Toll Free: [phone] Fax: [phone]

## 2017-02-16 ENCOUNTER — Other Ambulatory Visit: Payer: Self-pay | Admitting: *Deleted

## 2017-02-16 ENCOUNTER — Ambulatory Visit: Payer: Self-pay | Admitting: *Deleted

## 2017-02-16 NOTE — Patient Outreach (Signed)
[facility]) Care Management  02/16/2017  Brett Flynn [birth year] [id number]  Outreach attempt #2 to patient.  No answer. RN CM left HIPAA compliant message along with contact info.   Lake Bells, RN, BSN, MHA/MSL, [place] Telephonic Care Manager Coordinator [facility] Direct Phone: [phone] Cell Phone: [phone] Toll Free: [phone] Fax: [phone]

## 2017-02-16 NOTE — Patient Outreach (Signed)
[place] [facility]) Care Management  02/16/2017  KAHEEM HALLECK [birth year] [id number]   Transition of Care Referral  Referral Date: 02/10/17 Referral Source: HTA Urgent Outreach [facility] Date of Discharge: 02/07/17 Facility: [facility]) Discharge Diagnosis: Malignant neoplasm of middle lobe of right lung Insurance: HTA  Outreach attempt # 1 to patient. HIPAA identifiers verified with patient. Patient confirmed being in the hospital at [facility]. He stated, "I don't plan to return to the hospital". Patient verbalized several times how he felt towards the MD at [facility]. He believes, "He was given too many blood thinners, which caused him to pee out too much blood".  Patient reported, "He received 2 bags of blood one day before being discharged from the hospital". He stated, his Cardiologist visited him during his hospitalization. Per patient, his Cardiologist and the Oncologist gave him two different prognoses. Patient discussed having more trust in the Cardiologist due their long-term relationship and rapport. Patient explained how he was given a short time to live, less than 30 days. Patient stated, "The Oncologist wants to him to have more chemotherapy and other treatments. Patient stated, "He understands they want to inspire hope". He plans to pass away at home and he is not active with any palliative or hospice services. 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 He expressed not having much time for anything else. Patient stated, "I want to leave my house to my girlfriend. She has been very good to me". Per patient, he feels that his body has changed and he is nearing the end of his life. [facility] services and benefits explained to patient. Patient stated, he doesn't believe there is anything anyone can do for him at this point.   Plan: RN CM will notify [facility] CM administrative assistant regarding case closure.  RN CM will send MD  case closure letter. RN CM will send patient case closure letter.   Lake Bells, RN, BSN, MHA/MSL, [place] Telephonic Care Manager Coordinator [facility] Direct Phone: [phone] Cell Phone: [phone] Toll Free: [phone] Fax: [phone]  Lake Bells, RN, BSN, MHA/MSL, [place] Direct Phone: [phone] Toll Free: [phone] Fax: [phone]

## 2017-02-17 ENCOUNTER — Encounter: Payer: Self-pay | Admitting: *Deleted

## 2017-02-17 ENCOUNTER — Other Ambulatory Visit: Payer: Self-pay | Admitting: *Deleted

## 2017-02-28 DIAGNOSIS — I255 Ischemic cardiomyopathy: Secondary | ICD-10-CM | POA: Diagnosis not present

## 2017-02-28 DIAGNOSIS — I110 Hypertensive heart disease with heart failure: Secondary | ICD-10-CM | POA: Diagnosis not present

## 2017-02-28 DIAGNOSIS — I5022 Chronic systolic (congestive) heart failure: Secondary | ICD-10-CM | POA: Diagnosis not present

## 2017-02-28 DIAGNOSIS — I2510 Atherosclerotic heart disease of native coronary artery without angina pectoris: Secondary | ICD-10-CM | POA: Diagnosis not present

## 2017-03-09 DIAGNOSIS — I110 Hypertensive heart disease with heart failure: Secondary | ICD-10-CM | POA: Diagnosis not present

## 2017-03-09 DIAGNOSIS — I255 Ischemic cardiomyopathy: Secondary | ICD-10-CM | POA: Diagnosis not present

## 2017-03-09 DIAGNOSIS — I5020 Unspecified systolic (congestive) heart failure: Secondary | ICD-10-CM | POA: Diagnosis not present

## 2017-03-09 DIAGNOSIS — I5022 Chronic systolic (congestive) heart failure: Secondary | ICD-10-CM | POA: Diagnosis not present

## 2017-03-09 DIAGNOSIS — C3491 Malignant neoplasm of unspecified part of right bronchus or lung: Secondary | ICD-10-CM | POA: Diagnosis not present

## 2017-03-09 DIAGNOSIS — Z923 Personal history of irradiation: Secondary | ICD-10-CM | POA: Diagnosis not present

## 2017-03-09 DIAGNOSIS — R17 Unspecified jaundice: Secondary | ICD-10-CM | POA: Diagnosis not present

## 2017-03-09 DIAGNOSIS — J449 Chronic obstructive pulmonary disease, unspecified: Secondary | ICD-10-CM | POA: Diagnosis not present

## 2017-03-09 DIAGNOSIS — Z5111 Encounter for antineoplastic chemotherapy: Secondary | ICD-10-CM | POA: Diagnosis not present

## 2017-03-09 DIAGNOSIS — Z87891 Personal history of nicotine dependence: Secondary | ICD-10-CM | POA: Diagnosis not present

## 2017-03-09 DIAGNOSIS — I2510 Atherosclerotic heart disease of native coronary artery without angina pectoris: Secondary | ICD-10-CM | POA: Diagnosis not present

## 2017-03-09 DIAGNOSIS — Z79899 Other long term (current) drug therapy: Secondary | ICD-10-CM | POA: Diagnosis not present

## 2017-03-14 DIAGNOSIS — N6001 Solitary cyst of right breast: Secondary | ICD-10-CM | POA: Diagnosis not present

## 2017-03-14 DIAGNOSIS — C3491 Malignant neoplasm of unspecified part of right bronchus or lung: Secondary | ICD-10-CM | POA: Diagnosis not present

## 2017-03-14 DIAGNOSIS — J90 Pleural effusion, not elsewhere classified: Secondary | ICD-10-CM | POA: Diagnosis not present

## 2017-03-17 DIAGNOSIS — I5022 Chronic systolic (congestive) heart failure: Secondary | ICD-10-CM | POA: Diagnosis not present

## 2017-03-17 DIAGNOSIS — D649 Anemia, unspecified: Secondary | ICD-10-CM | POA: Diagnosis not present

## 2017-03-17 DIAGNOSIS — I110 Hypertensive heart disease with heart failure: Secondary | ICD-10-CM | POA: Diagnosis not present

## 2017-03-17 DIAGNOSIS — I255 Ischemic cardiomyopathy: Secondary | ICD-10-CM | POA: Diagnosis not present

## 2017-03-17 DIAGNOSIS — Z923 Personal history of irradiation: Secondary | ICD-10-CM | POA: Diagnosis not present

## 2017-03-17 DIAGNOSIS — C3431 Malignant neoplasm of lower lobe, right bronchus or lung: Secondary | ICD-10-CM | POA: Diagnosis not present

## 2017-03-17 DIAGNOSIS — Z87891 Personal history of nicotine dependence: Secondary | ICD-10-CM | POA: Diagnosis not present

## 2017-03-17 DIAGNOSIS — Z9221 Personal history of antineoplastic chemotherapy: Secondary | ICD-10-CM | POA: Diagnosis not present

## 2017-03-17 DIAGNOSIS — J449 Chronic obstructive pulmonary disease, unspecified: Secondary | ICD-10-CM | POA: Diagnosis not present

## 2017-03-17 DIAGNOSIS — C3491 Malignant neoplasm of unspecified part of right bronchus or lung: Secondary | ICD-10-CM | POA: Diagnosis not present

## 2017-03-17 DIAGNOSIS — I2510 Atherosclerotic heart disease of native coronary artery without angina pectoris: Secondary | ICD-10-CM | POA: Diagnosis not present

## 2017-03-17 DIAGNOSIS — I5020 Unspecified systolic (congestive) heart failure: Secondary | ICD-10-CM | POA: Diagnosis not present

## 2017-03-21 DIAGNOSIS — M533 Sacrococcygeal disorders, not elsewhere classified: Secondary | ICD-10-CM | POA: Diagnosis not present

## 2017-03-21 DIAGNOSIS — M5136 Other intervertebral disc degeneration, lumbar region: Secondary | ICD-10-CM | POA: Diagnosis not present

## 2017-03-21 DIAGNOSIS — M47816 Spondylosis without myelopathy or radiculopathy, lumbar region: Secondary | ICD-10-CM | POA: Diagnosis not present

## 2017-03-21 DIAGNOSIS — M545 Low back pain: Secondary | ICD-10-CM | POA: Diagnosis not present

## 2017-03-23 DIAGNOSIS — C3490 Malignant neoplasm of unspecified part of unspecified bronchus or lung: Secondary | ICD-10-CM | POA: Diagnosis not present

## 2017-03-23 DIAGNOSIS — I2510 Atherosclerotic heart disease of native coronary artery without angina pectoris: Secondary | ICD-10-CM | POA: Diagnosis not present

## 2017-03-23 DIAGNOSIS — Z87891 Personal history of nicotine dependence: Secondary | ICD-10-CM | POA: Diagnosis not present

## 2017-03-23 DIAGNOSIS — E7800 Pure hypercholesterolemia, unspecified: Secondary | ICD-10-CM | POA: Diagnosis not present

## 2017-03-23 DIAGNOSIS — Z5111 Encounter for antineoplastic chemotherapy: Secondary | ICD-10-CM | POA: Diagnosis not present

## 2017-03-23 DIAGNOSIS — Z452 Encounter for adjustment and management of vascular access device: Secondary | ICD-10-CM | POA: Diagnosis not present

## 2017-03-23 DIAGNOSIS — I501 Left ventricular failure: Secondary | ICD-10-CM | POA: Diagnosis not present

## 2017-03-23 DIAGNOSIS — Z85118 Personal history of other malignant neoplasm of bronchus and lung: Secondary | ICD-10-CM | POA: Diagnosis not present

## 2017-03-23 DIAGNOSIS — Z7982 Long term (current) use of aspirin: Secondary | ICD-10-CM | POA: Diagnosis not present

## 2017-03-29 DIAGNOSIS — I2510 Atherosclerotic heart disease of native coronary artery without angina pectoris: Secondary | ICD-10-CM | POA: Diagnosis not present

## 2017-03-29 DIAGNOSIS — C3491 Malignant neoplasm of unspecified part of right bronchus or lung: Secondary | ICD-10-CM | POA: Diagnosis not present

## 2017-03-29 DIAGNOSIS — I255 Ischemic cardiomyopathy: Secondary | ICD-10-CM | POA: Diagnosis not present

## 2017-03-29 DIAGNOSIS — I10 Essential (primary) hypertension: Secondary | ICD-10-CM | POA: Diagnosis not present

## 2017-03-31 DIAGNOSIS — J918 Pleural effusion in other conditions classified elsewhere: Secondary | ICD-10-CM | POA: Diagnosis not present

## 2017-03-31 DIAGNOSIS — E7800 Pure hypercholesterolemia, unspecified: Secondary | ICD-10-CM | POA: Diagnosis not present

## 2017-03-31 DIAGNOSIS — I5030 Unspecified diastolic (congestive) heart failure: Secondary | ICD-10-CM | POA: Diagnosis not present

## 2017-03-31 DIAGNOSIS — M545 Low back pain: Secondary | ICD-10-CM | POA: Diagnosis not present

## 2017-03-31 DIAGNOSIS — C3490 Malignant neoplasm of unspecified part of unspecified bronchus or lung: Secondary | ICD-10-CM | POA: Diagnosis not present

## 2017-03-31 DIAGNOSIS — Z79899 Other long term (current) drug therapy: Secondary | ICD-10-CM | POA: Diagnosis not present

## 2017-03-31 DIAGNOSIS — Z87891 Personal history of nicotine dependence: Secondary | ICD-10-CM | POA: Diagnosis not present

## 2017-03-31 DIAGNOSIS — M47816 Spondylosis without myelopathy or radiculopathy, lumbar region: Secondary | ICD-10-CM | POA: Diagnosis not present

## 2017-03-31 DIAGNOSIS — I700 Atherosclerosis of aorta: Secondary | ICD-10-CM | POA: Diagnosis not present

## 2017-03-31 DIAGNOSIS — Z955 Presence of coronary angioplasty implant and graft: Secondary | ICD-10-CM | POA: Diagnosis not present

## 2017-03-31 DIAGNOSIS — I110 Hypertensive heart disease with heart failure: Secondary | ICD-10-CM | POA: Diagnosis not present

## 2017-03-31 DIAGNOSIS — K5730 Diverticulosis of large intestine without perforation or abscess without bleeding: Secondary | ICD-10-CM | POA: Diagnosis not present

## 2017-03-31 DIAGNOSIS — C3491 Malignant neoplasm of unspecified part of right bronchus or lung: Secondary | ICD-10-CM | POA: Diagnosis not present

## 2017-03-31 DIAGNOSIS — G8929 Other chronic pain: Secondary | ICD-10-CM | POA: Diagnosis not present

## 2017-03-31 DIAGNOSIS — M5136 Other intervertebral disc degeneration, lumbar region: Secondary | ICD-10-CM | POA: Diagnosis not present

## 2017-03-31 DIAGNOSIS — Z515 Encounter for palliative care: Secondary | ICD-10-CM | POA: Diagnosis not present

## 2017-03-31 DIAGNOSIS — I5023 Acute on chronic systolic (congestive) heart failure: Secondary | ICD-10-CM | POA: Diagnosis not present

## 2017-03-31 DIAGNOSIS — J90 Pleural effusion, not elsewhere classified: Secondary | ICD-10-CM | POA: Diagnosis not present

## 2017-03-31 DIAGNOSIS — Z825 Family history of asthma and other chronic lower respiratory diseases: Secondary | ICD-10-CM | POA: Diagnosis not present

## 2017-03-31 DIAGNOSIS — I440 Atrioventricular block, first degree: Secondary | ICD-10-CM | POA: Diagnosis not present

## 2017-03-31 DIAGNOSIS — I255 Ischemic cardiomyopathy: Secondary | ICD-10-CM | POA: Diagnosis not present

## 2017-03-31 DIAGNOSIS — R0602 Shortness of breath: Secondary | ICD-10-CM | POA: Diagnosis not present

## 2017-03-31 DIAGNOSIS — I2510 Atherosclerotic heart disease of native coronary artery without angina pectoris: Secondary | ICD-10-CM | POA: Diagnosis not present

## 2017-03-31 DIAGNOSIS — J9811 Atelectasis: Secondary | ICD-10-CM | POA: Diagnosis not present

## 2017-03-31 DIAGNOSIS — J449 Chronic obstructive pulmonary disease, unspecified: Secondary | ICD-10-CM | POA: Diagnosis not present

## 2017-03-31 DIAGNOSIS — I252 Old myocardial infarction: Secondary | ICD-10-CM | POA: Diagnosis not present

## 2017-03-31 DIAGNOSIS — I447 Left bundle-branch block, unspecified: Secondary | ICD-10-CM | POA: Diagnosis not present

## 2017-04-06 ENCOUNTER — Other Ambulatory Visit: Payer: Self-pay

## 2017-04-06 NOTE — Patient Outreach (Signed)
[place] [facility]) Care Management  04/06/2017  Brett Flynn 23-Jan-1946 [id number]   Transition of care  Referral date: 04/05/17 Referral source: discharged from [facility]  On 04/03/17 Insurance: Health team advantage Attempt #1  Telephone call to patient regarding  Transition of care referral.  Unable to reach patient. HIPAA compliant voice message left with call back phone number.  PLAN; RNCM will attempt 2nd telephone call to patient within 3 business days.   [name] RN,BSN,CCM [facility] Telephonic  [phone]

## 2017-04-07 ENCOUNTER — Other Ambulatory Visit: Payer: Self-pay

## 2017-04-07 NOTE — Patient Outreach (Signed)
[facility]) Care Management  04/07/2017  Brett Flynn [birth year] [id number]   Transition of care  Referral date: 04/05/17 Referral source: discharged from [facility]  On 04/03/17 Insurance: Health team advantage   Telephone call to patient regarding transition of care follow up . HIPAA verified with patient. Discussed transition of care program with patient.  Patient states he was in the hospital because he has cancer and fluid built up on his lungs. Patient states he will be getting a new primary MD.  Patient states last primary MD visit was approximately 3-4 months ago. Patient states he has a follow up appointment with his oncologist but he is unsure of the specific date. Patient states, " my girlfriend handles all of this for me."  RNCM attempted to review medications with patient. Patient refused. Patient states," my girlfriend takes excellent care of me.  I know what I need and I have everything I need and I need to get back to my job that's making me money." Patient requested to end call.   PLAN:  RNCM will refer patient to care management assistant to close due to refusal of services.  RNCM will send [facility] brochure to patient  RNCM unable to send closure letter to patients doctor due to patient working on getting new provider.   [name] RN,BSN,CCM [facility] Telephonic  [phone]

## 2017-04-14 DIAGNOSIS — Z09 Encounter for follow-up examination after completed treatment for conditions other than malignant neoplasm: Secondary | ICD-10-CM | POA: Diagnosis not present

## 2017-04-14 DIAGNOSIS — Z7689 Persons encountering health services in other specified circumstances: Secondary | ICD-10-CM | POA: Diagnosis not present

## 2017-04-14 DIAGNOSIS — M533 Sacrococcygeal disorders, not elsewhere classified: Secondary | ICD-10-CM | POA: Diagnosis not present

## 2017-04-14 DIAGNOSIS — C3491 Malignant neoplasm of unspecified part of right bronchus or lung: Secondary | ICD-10-CM | POA: Diagnosis not present

## 2017-04-14 DIAGNOSIS — I5022 Chronic systolic (congestive) heart failure: Secondary | ICD-10-CM | POA: Diagnosis not present

## 2017-04-14 DIAGNOSIS — J438 Other emphysema: Secondary | ICD-10-CM | POA: Diagnosis not present

## 2017-04-20 DIAGNOSIS — C3491 Malignant neoplasm of unspecified part of right bronchus or lung: Secondary | ICD-10-CM | POA: Diagnosis not present

## 2017-04-20 DIAGNOSIS — I255 Ischemic cardiomyopathy: Secondary | ICD-10-CM | POA: Diagnosis not present

## 2017-04-20 DIAGNOSIS — I2510 Atherosclerotic heart disease of native coronary artery without angina pectoris: Secondary | ICD-10-CM | POA: Diagnosis not present

## 2017-04-20 DIAGNOSIS — I110 Hypertensive heart disease with heart failure: Secondary | ICD-10-CM | POA: Diagnosis not present

## 2017-05-02 DIAGNOSIS — Z87891 Personal history of nicotine dependence: Secondary | ICD-10-CM | POA: Diagnosis not present

## 2017-05-02 DIAGNOSIS — I5022 Chronic systolic (congestive) heart failure: Secondary | ICD-10-CM | POA: Diagnosis not present

## 2017-05-02 DIAGNOSIS — C3491 Malignant neoplasm of unspecified part of right bronchus or lung: Secondary | ICD-10-CM | POA: Diagnosis not present

## 2017-05-02 DIAGNOSIS — R110 Nausea: Secondary | ICD-10-CM | POA: Diagnosis not present

## 2017-05-02 DIAGNOSIS — I110 Hypertensive heart disease with heart failure: Secondary | ICD-10-CM | POA: Diagnosis not present

## 2017-05-13 DIAGNOSIS — I454 Nonspecific intraventricular block: Secondary | ICD-10-CM | POA: Diagnosis not present

## 2017-05-13 DIAGNOSIS — I5023 Acute on chronic systolic (congestive) heart failure: Secondary | ICD-10-CM | POA: Diagnosis not present

## 2017-05-13 DIAGNOSIS — I5022 Chronic systolic (congestive) heart failure: Secondary | ICD-10-CM | POA: Diagnosis not present

## 2017-05-13 DIAGNOSIS — I440 Atrioventricular block, first degree: Secondary | ICD-10-CM | POA: Diagnosis not present

## 2017-05-13 DIAGNOSIS — J449 Chronic obstructive pulmonary disease, unspecified: Secondary | ICD-10-CM | POA: Diagnosis not present

## 2017-05-13 DIAGNOSIS — J90 Pleural effusion, not elsewhere classified: Secondary | ICD-10-CM | POA: Diagnosis not present

## 2017-05-13 DIAGNOSIS — F419 Anxiety disorder, unspecified: Secondary | ICD-10-CM | POA: Diagnosis not present

## 2017-05-13 DIAGNOSIS — Z87891 Personal history of nicotine dependence: Secondary | ICD-10-CM | POA: Diagnosis not present

## 2017-05-13 DIAGNOSIS — R0603 Acute respiratory distress: Secondary | ICD-10-CM | POA: Diagnosis not present

## 2017-05-13 DIAGNOSIS — I252 Old myocardial infarction: Secondary | ICD-10-CM | POA: Diagnosis not present

## 2017-05-13 DIAGNOSIS — C3491 Malignant neoplasm of unspecified part of right bronchus or lung: Secondary | ICD-10-CM | POA: Diagnosis not present

## 2017-05-13 DIAGNOSIS — E7800 Pure hypercholesterolemia, unspecified: Secondary | ICD-10-CM | POA: Diagnosis not present

## 2017-05-13 DIAGNOSIS — I429 Cardiomyopathy, unspecified: Secondary | ICD-10-CM | POA: Diagnosis not present

## 2017-05-13 DIAGNOSIS — Z825 Family history of asthma and other chronic lower respiratory diseases: Secondary | ICD-10-CM | POA: Diagnosis not present

## 2017-05-13 DIAGNOSIS — Z79899 Other long term (current) drug therapy: Secondary | ICD-10-CM | POA: Diagnosis not present

## 2017-05-13 DIAGNOSIS — Z955 Presence of coronary angioplasty implant and graft: Secondary | ICD-10-CM | POA: Diagnosis not present

## 2017-05-13 DIAGNOSIS — J918 Pleural effusion in other conditions classified elsewhere: Secondary | ICD-10-CM | POA: Diagnosis not present

## 2017-05-13 DIAGNOSIS — Z8349 Family history of other endocrine, nutritional and metabolic diseases: Secondary | ICD-10-CM | POA: Diagnosis not present

## 2017-05-13 DIAGNOSIS — Z818 Family history of other mental and behavioral disorders: Secondary | ICD-10-CM | POA: Diagnosis not present

## 2017-05-13 DIAGNOSIS — I272 Pulmonary hypertension, unspecified: Secondary | ICD-10-CM | POA: Diagnosis not present

## 2017-05-13 DIAGNOSIS — R0602 Shortness of breath: Secondary | ICD-10-CM | POA: Diagnosis not present

## 2017-05-13 DIAGNOSIS — I2510 Atherosclerotic heart disease of native coronary artery without angina pectoris: Secondary | ICD-10-CM | POA: Diagnosis not present

## 2017-05-13 DIAGNOSIS — I110 Hypertensive heart disease with heart failure: Secondary | ICD-10-CM | POA: Diagnosis not present

## 2017-05-13 DIAGNOSIS — I255 Ischemic cardiomyopathy: Secondary | ICD-10-CM | POA: Diagnosis not present

## 2017-05-16 ENCOUNTER — Other Ambulatory Visit: Payer: Self-pay

## 2017-05-16 NOTE — Patient Outreach (Signed)
[facility]) Care Management  05/16/2017  Brett Flynn 06-23-1945 [id number]     Transition of Care Referral  Referral Date: 05/16/17  Referral Source:HTA Discharge Report Date of Admission: unknown Diagnosis:malignant neoplasm of bronchus and lung Date of Discharge: 05/15/17 Facility: [facility] Insurance: HTA    Outreach attempt # 1 to patient.  Spoke with patient who seemed annoyed by the call. He answered RN CM questions with one word answers. He confirmed that he has his meds and no issues or concerns with them. He has MD follow up appts in place and plans to drive himself. He voices he is doing as well as to be expected and there is nothing further that RN CM or [facility] can do for him at this time.     Plan: RN CM will close case at this time.    Enzo Montgomery, RN,BSN,CCM [place] [facility] Telephonic Care Management Coordinator Direct Phone: [phone] Toll Free: [phone] Fax: [phone]

## 2017-05-22 DIAGNOSIS — Z09 Encounter for follow-up examination after completed treatment for conditions other than malignant neoplasm: Secondary | ICD-10-CM | POA: Diagnosis not present

## 2017-05-22 DIAGNOSIS — I5022 Chronic systolic (congestive) heart failure: Secondary | ICD-10-CM | POA: Diagnosis not present

## 2017-05-22 DIAGNOSIS — C3491 Malignant neoplasm of unspecified part of right bronchus or lung: Secondary | ICD-10-CM | POA: Diagnosis not present

## 2017-05-26 DIAGNOSIS — Z13220 Encounter for screening for lipoid disorders: Secondary | ICD-10-CM | POA: Diagnosis not present

## 2017-05-26 DIAGNOSIS — C3491 Malignant neoplasm of unspecified part of right bronchus or lung: Secondary | ICD-10-CM | POA: Diagnosis not present

## 2017-05-31 DIAGNOSIS — K5730 Diverticulosis of large intestine without perforation or abscess without bleeding: Secondary | ICD-10-CM | POA: Diagnosis not present

## 2017-05-31 DIAGNOSIS — J90 Pleural effusion, not elsewhere classified: Secondary | ICD-10-CM | POA: Diagnosis not present

## 2017-05-31 DIAGNOSIS — K8020 Calculus of gallbladder without cholecystitis without obstruction: Secondary | ICD-10-CM | POA: Diagnosis not present

## 2017-06-14 DIAGNOSIS — I509 Heart failure, unspecified: Secondary | ICD-10-CM | POA: Diagnosis not present

## 2017-06-14 DIAGNOSIS — J90 Pleural effusion, not elsewhere classified: Secondary | ICD-10-CM | POA: Diagnosis not present

## 2017-06-16 DIAGNOSIS — Z955 Presence of coronary angioplasty implant and graft: Secondary | ICD-10-CM | POA: Diagnosis not present

## 2017-06-16 DIAGNOSIS — I440 Atrioventricular block, first degree: Secondary | ICD-10-CM | POA: Diagnosis not present

## 2017-06-16 DIAGNOSIS — J9621 Acute and chronic respiratory failure with hypoxia: Secondary | ICD-10-CM | POA: Diagnosis not present

## 2017-06-16 DIAGNOSIS — I252 Old myocardial infarction: Secondary | ICD-10-CM | POA: Diagnosis not present

## 2017-06-16 DIAGNOSIS — Z87891 Personal history of nicotine dependence: Secondary | ICD-10-CM | POA: Diagnosis not present

## 2017-06-16 DIAGNOSIS — M5136 Other intervertebral disc degeneration, lumbar region: Secondary | ICD-10-CM | POA: Diagnosis not present

## 2017-06-16 DIAGNOSIS — D649 Anemia, unspecified: Secondary | ICD-10-CM | POA: Diagnosis not present

## 2017-06-16 DIAGNOSIS — I5022 Chronic systolic (congestive) heart failure: Secondary | ICD-10-CM | POA: Diagnosis not present

## 2017-06-16 DIAGNOSIS — I255 Ischemic cardiomyopathy: Secondary | ICD-10-CM | POA: Diagnosis not present

## 2017-06-16 DIAGNOSIS — I447 Left bundle-branch block, unspecified: Secondary | ICD-10-CM | POA: Diagnosis not present

## 2017-06-16 DIAGNOSIS — I110 Hypertensive heart disease with heart failure: Secondary | ICD-10-CM | POA: Diagnosis not present

## 2017-06-16 DIAGNOSIS — Z79899 Other long term (current) drug therapy: Secondary | ICD-10-CM | POA: Diagnosis not present

## 2017-06-16 DIAGNOSIS — R0602 Shortness of breath: Secondary | ICD-10-CM | POA: Diagnosis not present

## 2017-06-16 DIAGNOSIS — J9601 Acute respiratory failure with hypoxia: Secondary | ICD-10-CM | POA: Diagnosis not present

## 2017-06-16 DIAGNOSIS — J90 Pleural effusion, not elsewhere classified: Secondary | ICD-10-CM | POA: Diagnosis not present

## 2017-06-16 DIAGNOSIS — I2510 Atherosclerotic heart disease of native coronary artery without angina pectoris: Secondary | ICD-10-CM | POA: Diagnosis not present

## 2017-06-16 DIAGNOSIS — C3491 Malignant neoplasm of unspecified part of right bronchus or lung: Secondary | ICD-10-CM | POA: Diagnosis not present

## 2017-06-16 DIAGNOSIS — Z48813 Encounter for surgical aftercare following surgery on the respiratory system: Secondary | ICD-10-CM | POA: Diagnosis not present

## 2017-06-16 DIAGNOSIS — C3431 Malignant neoplasm of lower lobe, right bronchus or lung: Secondary | ICD-10-CM | POA: Diagnosis not present

## 2017-06-16 DIAGNOSIS — I420 Dilated cardiomyopathy: Secondary | ICD-10-CM | POA: Diagnosis not present

## 2017-06-16 DIAGNOSIS — E7800 Pure hypercholesterolemia, unspecified: Secondary | ICD-10-CM | POA: Diagnosis not present

## 2017-06-16 DIAGNOSIS — J449 Chronic obstructive pulmonary disease, unspecified: Secondary | ICD-10-CM | POA: Diagnosis not present

## 2017-06-16 DIAGNOSIS — J910 Malignant pleural effusion: Secondary | ICD-10-CM | POA: Diagnosis not present

## 2017-06-16 DIAGNOSIS — I509 Heart failure, unspecified: Secondary | ICD-10-CM | POA: Diagnosis not present

## 2017-06-16 DIAGNOSIS — G8929 Other chronic pain: Secondary | ICD-10-CM | POA: Diagnosis not present

## 2017-06-26 DIAGNOSIS — J90 Pleural effusion, not elsewhere classified: Secondary | ICD-10-CM | POA: Diagnosis not present

## 2017-06-26 DIAGNOSIS — Z9981 Dependence on supplemental oxygen: Secondary | ICD-10-CM | POA: Diagnosis not present

## 2017-06-26 DIAGNOSIS — C3491 Malignant neoplasm of unspecified part of right bronchus or lung: Secondary | ICD-10-CM | POA: Diagnosis not present

## 2017-06-26 DIAGNOSIS — I700 Atherosclerosis of aorta: Secondary | ICD-10-CM | POA: Diagnosis not present

## 2017-06-26 DIAGNOSIS — C3490 Malignant neoplasm of unspecified part of unspecified bronchus or lung: Secondary | ICD-10-CM | POA: Diagnosis not present

## 2017-06-26 DIAGNOSIS — F419 Anxiety disorder, unspecified: Secondary | ICD-10-CM | POA: Diagnosis not present

## 2017-06-27 ENCOUNTER — Other Ambulatory Visit: Payer: Self-pay

## 2017-06-27 NOTE — Patient Outreach (Signed)
[facility]) Care Management  06/27/2017  Brett Flynn 11-Jul-1945 [id number]   [employer] Referral from [facility] on 06/23/17. RNCM called to follow up. RNCM confirmed 2 patient identifiers.   Transition of care completed. Client reports he was admitted due to fluid in his lungs. Client reports they drew the fluid off of his lungs and he is feeling better and breathing better. Client reports a history of heart failure and COPD, but states he was not admitted for either of these reasons. Client denies any questions or concerns.  RNCM discussed care management program. Client states, " I am fine" and declines Care Management services.  Primary care provided not listed in system.  Plan: send successful outreach letter and close case.  Thea Silversmith, RN, MSN, [place] Coordinator Cell: [phone]

## 2017-06-29 DIAGNOSIS — I255 Ischemic cardiomyopathy: Secondary | ICD-10-CM | POA: Diagnosis not present

## 2017-06-29 DIAGNOSIS — J90 Pleural effusion, not elsewhere classified: Secondary | ICD-10-CM | POA: Diagnosis not present

## 2017-06-29 DIAGNOSIS — Z5112 Encounter for antineoplastic immunotherapy: Secondary | ICD-10-CM | POA: Diagnosis not present

## 2017-06-29 DIAGNOSIS — Z5111 Encounter for antineoplastic chemotherapy: Secondary | ICD-10-CM | POA: Diagnosis not present

## 2017-06-29 DIAGNOSIS — I5020 Unspecified systolic (congestive) heart failure: Secondary | ICD-10-CM | POA: Diagnosis not present

## 2017-06-29 DIAGNOSIS — C3431 Malignant neoplasm of lower lobe, right bronchus or lung: Secondary | ICD-10-CM | POA: Diagnosis not present

## 2017-06-29 DIAGNOSIS — Z95828 Presence of other vascular implants and grafts: Secondary | ICD-10-CM | POA: Diagnosis not present

## 2017-06-29 DIAGNOSIS — I110 Hypertensive heart disease with heart failure: Secondary | ICD-10-CM | POA: Diagnosis not present

## 2017-06-29 DIAGNOSIS — E7800 Pure hypercholesterolemia, unspecified: Secondary | ICD-10-CM | POA: Diagnosis not present

## 2017-06-29 DIAGNOSIS — Z9221 Personal history of antineoplastic chemotherapy: Secondary | ICD-10-CM | POA: Diagnosis not present

## 2017-06-29 DIAGNOSIS — Z7982 Long term (current) use of aspirin: Secondary | ICD-10-CM | POA: Diagnosis not present

## 2017-06-29 DIAGNOSIS — Z9981 Dependence on supplemental oxygen: Secondary | ICD-10-CM | POA: Diagnosis not present

## 2017-06-29 DIAGNOSIS — I2510 Atherosclerotic heart disease of native coronary artery without angina pectoris: Secondary | ICD-10-CM | POA: Diagnosis not present

## 2017-06-29 DIAGNOSIS — C3491 Malignant neoplasm of unspecified part of right bronchus or lung: Secondary | ICD-10-CM | POA: Diagnosis not present

## 2017-06-29 DIAGNOSIS — Z7189 Other specified counseling: Secondary | ICD-10-CM | POA: Diagnosis not present

## 2017-06-29 DIAGNOSIS — I5022 Chronic systolic (congestive) heart failure: Secondary | ICD-10-CM | POA: Diagnosis not present

## 2017-06-29 DIAGNOSIS — J449 Chronic obstructive pulmonary disease, unspecified: Secondary | ICD-10-CM | POA: Diagnosis not present

## 2017-06-29 DIAGNOSIS — Z87891 Personal history of nicotine dependence: Secondary | ICD-10-CM | POA: Diagnosis not present

## 2017-06-29 DIAGNOSIS — D649 Anemia, unspecified: Secondary | ICD-10-CM | POA: Diagnosis not present

## 2017-06-29 DIAGNOSIS — I252 Old myocardial infarction: Secondary | ICD-10-CM | POA: Diagnosis not present

## 2017-07-03 DIAGNOSIS — C3491 Malignant neoplasm of unspecified part of right bronchus or lung: Secondary | ICD-10-CM | POA: Diagnosis not present

## 2017-07-05 DIAGNOSIS — J910 Malignant pleural effusion: Secondary | ICD-10-CM | POA: Diagnosis not present

## 2017-07-05 DIAGNOSIS — J90 Pleural effusion, not elsewhere classified: Secondary | ICD-10-CM | POA: Diagnosis not present

## 2017-07-06 DIAGNOSIS — C3491 Malignant neoplasm of unspecified part of right bronchus or lung: Secondary | ICD-10-CM | POA: Diagnosis not present

## 2017-07-19 DIAGNOSIS — C3491 Malignant neoplasm of unspecified part of right bronchus or lung: Secondary | ICD-10-CM | POA: Diagnosis not present

## 2017-07-27 DIAGNOSIS — I5022 Chronic systolic (congestive) heart failure: Secondary | ICD-10-CM | POA: Diagnosis not present

## 2017-07-31 DIAGNOSIS — J910 Malignant pleural effusion: Secondary | ICD-10-CM | POA: Diagnosis not present

## 2017-07-31 DIAGNOSIS — I5022 Chronic systolic (congestive) heart failure: Secondary | ICD-10-CM | POA: Diagnosis not present

## 2017-07-31 DIAGNOSIS — M533 Sacrococcygeal disorders, not elsewhere classified: Secondary | ICD-10-CM | POA: Diagnosis not present

## 2017-07-31 DIAGNOSIS — C3491 Malignant neoplasm of unspecified part of right bronchus or lung: Secondary | ICD-10-CM | POA: Diagnosis not present

## 2017-07-31 DIAGNOSIS — E559 Vitamin D deficiency, unspecified: Secondary | ICD-10-CM | POA: Diagnosis not present

## 2017-08-10 DIAGNOSIS — I5022 Chronic systolic (congestive) heart failure: Secondary | ICD-10-CM | POA: Diagnosis not present

## 2017-08-10 DIAGNOSIS — Z95828 Presence of other vascular implants and grafts: Secondary | ICD-10-CM | POA: Diagnosis not present

## 2017-08-10 DIAGNOSIS — Z87891 Personal history of nicotine dependence: Secondary | ICD-10-CM | POA: Diagnosis not present

## 2017-08-10 DIAGNOSIS — C3431 Malignant neoplasm of lower lobe, right bronchus or lung: Secondary | ICD-10-CM | POA: Diagnosis not present

## 2017-08-10 DIAGNOSIS — I2510 Atherosclerotic heart disease of native coronary artery without angina pectoris: Secondary | ICD-10-CM | POA: Diagnosis not present

## 2017-08-10 DIAGNOSIS — D630 Anemia in neoplastic disease: Secondary | ICD-10-CM | POA: Diagnosis not present

## 2017-08-10 DIAGNOSIS — J449 Chronic obstructive pulmonary disease, unspecified: Secondary | ICD-10-CM | POA: Diagnosis not present

## 2017-08-10 DIAGNOSIS — Z5111 Encounter for antineoplastic chemotherapy: Secondary | ICD-10-CM | POA: Diagnosis not present

## 2017-08-10 DIAGNOSIS — J90 Pleural effusion, not elsewhere classified: Secondary | ICD-10-CM | POA: Diagnosis not present

## 2017-08-10 DIAGNOSIS — Z9981 Dependence on supplemental oxygen: Secondary | ICD-10-CM | POA: Diagnosis not present

## 2017-08-10 DIAGNOSIS — D649 Anemia, unspecified: Secondary | ICD-10-CM | POA: Diagnosis not present

## 2017-08-10 DIAGNOSIS — C3491 Malignant neoplasm of unspecified part of right bronchus or lung: Secondary | ICD-10-CM | POA: Diagnosis not present

## 2017-08-10 DIAGNOSIS — J438 Other emphysema: Secondary | ICD-10-CM | POA: Diagnosis not present

## 2017-08-21 ENCOUNTER — Other Ambulatory Visit: Payer: Self-pay | Admitting: *Deleted

## 2017-08-21 NOTE — Patient Outreach (Addendum)
High Risk HTA Patient, referred from Multidisciplinary committee. Spoke with Mr. Peatross this morning. He reports he is no longer receiving any cancer treatment but he is not ready to give up. He would like a second opinion at [facility] or [facility]. He has called and left messages but has not received a return call.  I have advised I will try to call and see what I can do. I called [facility] and was able to leave a message for a return call to the pt himself. The voicemail said they would return the call within 24 hours.  I also called [facility] in [place] and found out that they do not accept [employer] HMO insurance. However, the representative is emailing me some resources and gave me the number for [employer] a company that does provide second opinions and clinical trials. I have called and left a message with them to call this pt.  Talked with pt girlfriend and got her email and sent the information she needs to follow up on to get the second opinion at [facility] or at [employer].  Eulah Pont. [name], MSN, [facility] Gerontological Nurse Practitioner [facility] Care Management [phone]

## 2017-08-22 DIAGNOSIS — I255 Ischemic cardiomyopathy: Secondary | ICD-10-CM | POA: Diagnosis not present

## 2017-08-22 DIAGNOSIS — I2510 Atherosclerotic heart disease of native coronary artery without angina pectoris: Secondary | ICD-10-CM | POA: Diagnosis not present

## 2017-08-22 DIAGNOSIS — C3491 Malignant neoplasm of unspecified part of right bronchus or lung: Secondary | ICD-10-CM | POA: Diagnosis not present

## 2017-08-22 DIAGNOSIS — I110 Hypertensive heart disease with heart failure: Secondary | ICD-10-CM | POA: Diagnosis not present

## 2017-08-25 ENCOUNTER — Other Ambulatory Visit: Payer: Self-pay | Admitting: *Deleted

## 2017-08-25 NOTE — Patient Outreach (Signed)
Follow up on HTA High Risk Patient. Brett Flynn did received my email but she has not heard back from [facility] or [employer]. She will follow up with them today.  I introduced the idea of palliative care and they are in agreement this would be a good idea.  I have referred the pt today to [facility].  [name] to keep me in the loop with these other groups and developments.  Brett Flynn. [name], MSN, [facility] Gerontological Nurse Practitioner [facility] Care Management [phone]

## 2017-08-26 DIAGNOSIS — I5022 Chronic systolic (congestive) heart failure: Secondary | ICD-10-CM | POA: Diagnosis not present

## 2017-09-05 ENCOUNTER — Other Ambulatory Visit: Payer: Self-pay | Admitting: *Deleted

## 2017-09-05 NOTE — Patient Outreach (Signed)
Called and talked with pt's signficiant other, Velta Addison. She reports [facility] has been out and is engaged. 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 When that happens they will consider his other options that we have previously discussed.  I have asked [name] if she will please keep me up to date on significant actions or occurrences in their lives. She has stated that she will.  Eulah Pont. [name], MSN, [facility] Gerontological Nurse Practitioner [facility] Care Management [phone]

## 2017-09-06 DIAGNOSIS — Z5111 Encounter for antineoplastic chemotherapy: Secondary | ICD-10-CM | POA: Diagnosis not present

## 2017-09-06 DIAGNOSIS — R918 Other nonspecific abnormal finding of lung field: Secondary | ICD-10-CM | POA: Diagnosis not present

## 2017-09-06 DIAGNOSIS — J90 Pleural effusion, not elsewhere classified: Secondary | ICD-10-CM | POA: Diagnosis not present

## 2017-09-06 DIAGNOSIS — C3431 Malignant neoplasm of lower lobe, right bronchus or lung: Secondary | ICD-10-CM | POA: Diagnosis not present

## 2017-09-20 DIAGNOSIS — D649 Anemia, unspecified: Secondary | ICD-10-CM | POA: Diagnosis not present

## 2017-09-20 DIAGNOSIS — I2510 Atherosclerotic heart disease of native coronary artery without angina pectoris: Secondary | ICD-10-CM | POA: Diagnosis not present

## 2017-09-20 DIAGNOSIS — J449 Chronic obstructive pulmonary disease, unspecified: Secondary | ICD-10-CM | POA: Diagnosis not present

## 2017-09-20 DIAGNOSIS — I5022 Chronic systolic (congestive) heart failure: Secondary | ICD-10-CM | POA: Diagnosis not present

## 2017-09-20 DIAGNOSIS — J90 Pleural effusion, not elsewhere classified: Secondary | ICD-10-CM | POA: Diagnosis not present

## 2017-09-20 DIAGNOSIS — C3491 Malignant neoplasm of unspecified part of right bronchus or lung: Secondary | ICD-10-CM | POA: Diagnosis not present

## 2017-09-26 DIAGNOSIS — I5022 Chronic systolic (congestive) heart failure: Secondary | ICD-10-CM | POA: Diagnosis not present

## 2017-10-23 DIAGNOSIS — C3491 Malignant neoplasm of unspecified part of right bronchus or lung: Secondary | ICD-10-CM | POA: Diagnosis not present

## 2017-10-23 DIAGNOSIS — J910 Malignant pleural effusion: Secondary | ICD-10-CM | POA: Diagnosis not present

## 2017-10-23 DIAGNOSIS — I5022 Chronic systolic (congestive) heart failure: Secondary | ICD-10-CM | POA: Diagnosis not present

## 2017-10-24 DIAGNOSIS — I110 Hypertensive heart disease with heart failure: Secondary | ICD-10-CM | POA: Diagnosis not present

## 2017-10-24 DIAGNOSIS — Z87891 Personal history of nicotine dependence: Secondary | ICD-10-CM | POA: Diagnosis not present

## 2017-10-24 DIAGNOSIS — Z7982 Long term (current) use of aspirin: Secondary | ICD-10-CM | POA: Diagnosis not present

## 2017-10-24 DIAGNOSIS — I2510 Atherosclerotic heart disease of native coronary artery without angina pectoris: Secondary | ICD-10-CM | POA: Diagnosis not present

## 2017-10-24 DIAGNOSIS — E7800 Pure hypercholesterolemia, unspecified: Secondary | ICD-10-CM | POA: Diagnosis not present

## 2017-10-24 DIAGNOSIS — I255 Ischemic cardiomyopathy: Secondary | ICD-10-CM | POA: Diagnosis not present

## 2017-10-24 DIAGNOSIS — I5022 Chronic systolic (congestive) heart failure: Secondary | ICD-10-CM | POA: Diagnosis not present

## 2017-10-24 DIAGNOSIS — C3491 Malignant neoplasm of unspecified part of right bronchus or lung: Secondary | ICD-10-CM | POA: Diagnosis not present

## 2017-10-24 DIAGNOSIS — J90 Pleural effusion, not elsewhere classified: Secondary | ICD-10-CM | POA: Diagnosis not present

## 2017-10-27 DIAGNOSIS — I5022 Chronic systolic (congestive) heart failure: Secondary | ICD-10-CM | POA: Diagnosis not present

## 2017-10-31 DIAGNOSIS — E559 Vitamin D deficiency, unspecified: Secondary | ICD-10-CM | POA: Diagnosis not present

## 2017-10-31 DIAGNOSIS — J90 Pleural effusion, not elsewhere classified: Secondary | ICD-10-CM | POA: Diagnosis not present

## 2017-10-31 DIAGNOSIS — R5383 Other fatigue: Secondary | ICD-10-CM | POA: Diagnosis not present

## 2017-10-31 DIAGNOSIS — I10 Essential (primary) hypertension: Secondary | ICD-10-CM | POA: Diagnosis not present

## 2017-10-31 DIAGNOSIS — M533 Sacrococcygeal disorders, not elsewhere classified: Secondary | ICD-10-CM | POA: Diagnosis not present

## 2017-10-31 DIAGNOSIS — I110 Hypertensive heart disease with heart failure: Secondary | ICD-10-CM | POA: Diagnosis not present

## 2017-10-31 DIAGNOSIS — Z0000 Encounter for general adult medical examination without abnormal findings: Secondary | ICD-10-CM | POA: Diagnosis not present

## 2017-10-31 DIAGNOSIS — G8929 Other chronic pain: Secondary | ICD-10-CM | POA: Diagnosis not present

## 2017-10-31 DIAGNOSIS — I5022 Chronic systolic (congestive) heart failure: Secondary | ICD-10-CM | POA: Diagnosis not present

## 2017-11-07 DIAGNOSIS — J910 Malignant pleural effusion: Secondary | ICD-10-CM | POA: Diagnosis not present

## 2017-11-07 DIAGNOSIS — I5022 Chronic systolic (congestive) heart failure: Secondary | ICD-10-CM | POA: Diagnosis not present

## 2017-11-07 DIAGNOSIS — C3491 Malignant neoplasm of unspecified part of right bronchus or lung: Secondary | ICD-10-CM | POA: Diagnosis not present

## 2017-11-21 DIAGNOSIS — I2510 Atherosclerotic heart disease of native coronary artery without angina pectoris: Secondary | ICD-10-CM | POA: Diagnosis not present

## 2017-11-21 DIAGNOSIS — Z7982 Long term (current) use of aspirin: Secondary | ICD-10-CM | POA: Diagnosis not present

## 2017-11-21 DIAGNOSIS — I255 Ischemic cardiomyopathy: Secondary | ICD-10-CM | POA: Diagnosis not present

## 2017-11-21 DIAGNOSIS — E7800 Pure hypercholesterolemia, unspecified: Secondary | ICD-10-CM | POA: Diagnosis not present

## 2017-11-21 DIAGNOSIS — C3491 Malignant neoplasm of unspecified part of right bronchus or lung: Secondary | ICD-10-CM | POA: Diagnosis not present

## 2017-11-21 DIAGNOSIS — I110 Hypertensive heart disease with heart failure: Secondary | ICD-10-CM | POA: Diagnosis not present

## 2017-11-21 DIAGNOSIS — I5022 Chronic systolic (congestive) heart failure: Secondary | ICD-10-CM | POA: Diagnosis not present

## 2017-11-21 DIAGNOSIS — J90 Pleural effusion, not elsewhere classified: Secondary | ICD-10-CM | POA: Diagnosis not present

## 2017-11-26 DIAGNOSIS — I5022 Chronic systolic (congestive) heart failure: Secondary | ICD-10-CM | POA: Diagnosis not present

## 2017-11-28 DIAGNOSIS — F419 Anxiety disorder, unspecified: Secondary | ICD-10-CM | POA: Diagnosis not present

## 2017-11-28 DIAGNOSIS — G8929 Other chronic pain: Secondary | ICD-10-CM | POA: Diagnosis not present

## 2017-11-28 DIAGNOSIS — R531 Weakness: Secondary | ICD-10-CM | POA: Diagnosis not present

## 2017-11-28 DIAGNOSIS — M533 Sacrococcygeal disorders, not elsewhere classified: Secondary | ICD-10-CM | POA: Diagnosis not present

## 2017-11-28 DIAGNOSIS — Z23 Encounter for immunization: Secondary | ICD-10-CM | POA: Diagnosis not present

## 2017-11-28 DIAGNOSIS — C3491 Malignant neoplasm of unspecified part of right bronchus or lung: Secondary | ICD-10-CM | POA: Diagnosis not present

## 2017-11-30 ENCOUNTER — Other Ambulatory Visit: Payer: Self-pay | Admitting: *Deleted

## 2017-11-30 DIAGNOSIS — G936 Cerebral edema: Secondary | ICD-10-CM | POA: Diagnosis not present

## 2017-11-30 DIAGNOSIS — R531 Weakness: Secondary | ICD-10-CM | POA: Diagnosis not present

## 2017-11-30 DIAGNOSIS — G939 Disorder of brain, unspecified: Secondary | ICD-10-CM | POA: Diagnosis not present

## 2017-11-30 DIAGNOSIS — S0990XA Unspecified injury of head, initial encounter: Secondary | ICD-10-CM | POA: Diagnosis not present

## 2017-11-30 NOTE — Patient Outreach (Signed)
HTA - [place] follow up per [employer] referral.  I spoke with Mr. Neisen and Veryl Speak (pt's significant & caregiver) today. They tell me they were never able to get a 2nd opinion this summer. They report everyone they called would not return a call.  He has been active with [facility] since my referral last July. They have been coming every couple of weeks and now will be starting to come weekly and he will have an official Hospice referral.  He has had some signs of possible metastatic disease from his previously diagnosed squamous cell lung cancer. He is no longer able to walk and his upper extremities are very weak.  [name] has been having to take off from work which is putting quit a financial burden on them. They have been looking for caregivers that they can afford to stay with him while she continues to work.  He has not seen an oncologist since the end of June (Dr. Cruzita Lederer).  The pt is scheduled for a CT of the head today.  I discussed mobility needs with [name]. She has a 2nd hand, what sounds like, transfer chair which is in poor condition.  She has a belt for transfers she purchased on line. He may need an upgrade to the wheel chair and possibly a [employer] fast streamer. We also discussed using a draw sheet for easier movement in bed and to assist with transfers out of the bed.  I have put in a call to [facility] and am expecting a return call to ensure pt is getting the care he requires at this time.  I have advised them that I will be back in touch with them with any new information and within the next week.  They have my number for any questions.  Eulah Pont. [name], MSN, [facility] Gerontological Nurse Practitioner [facility] Care Management [phone]

## 2017-12-01 ENCOUNTER — Telehealth: Payer: Self-pay | Admitting: Hematology

## 2017-12-01 ENCOUNTER — Encounter: Payer: Self-pay | Admitting: Hematology & Oncology

## 2017-12-01 ENCOUNTER — Ambulatory Visit: Payer: Self-pay | Admitting: *Deleted

## 2017-12-01 NOTE — Patient Outreach (Signed)
[place] [facility]) Care Management  12/01/2017  Brett Flynn 02-Feb-1946 [id number]  Talked with [facility] yesterday. I was told that the nurse saw [name], Wednesday, and they talked about increasing the frequency of his visits and about referral to Hospice in [facility] in which they are partnered with.  I advised pt could benefit from receiving a W/C, and perhaps a [employer]. I had talked with pt's girlfriend and caregiver regarding these items and she is in agreement.   [name] advises they are taking him to the beach this weekend while they can manage him.  I will be calling them next Tuesday to follow up about his CT scan and plan of care.  Eulah Pont. [name], MSN, [facility] Gerontological Nurse Practitioner [facility] Care Management [phone]

## 2017-12-01 NOTE — Telephone Encounter (Signed)
Spoke with patient family member in regardes to new patient appt on 12/14/17 at 230 pm per Dr Maylon Peppers. Family member stated ithe appt was for a second opinion and she will call me back to see if they want to keep the appt based off some news they received on yesterday. Gave her our number [phone]

## 2017-12-05 ENCOUNTER — Other Ambulatory Visit: Payer: Self-pay | Admitting: *Deleted

## 2017-12-05 NOTE — Patient Outreach (Signed)
Telephone outreach. I spoke to Veryl Speak who reports they are still at the beach and are getting ready to return home. She says they have had a wonderful time.Mr. Angelino has not learned of his CT Scan results yet. [name] has said that she did not want to ruin this trip and will share the results when they return home. They have an appt for oncology consult at the end of the month. They do have palliative care and will probably be transitioning to hospice care.  I advised [name] that I will be following up with them after their appt with the oncologist next week.  I have encouraged her to call me if I can offer any support.  Eulah Pont. [name], MSN, [facility] Gerontological Nurse Practitioner [facility] Care Management [phone]

## 2017-12-13 ENCOUNTER — Other Ambulatory Visit: Payer: Self-pay | Admitting: Hematology

## 2017-12-13 DIAGNOSIS — D649 Anemia, unspecified: Secondary | ICD-10-CM

## 2017-12-13 DIAGNOSIS — C3491 Malignant neoplasm of unspecified part of right bronchus or lung: Secondary | ICD-10-CM

## 2017-12-13 DIAGNOSIS — I5022 Chronic systolic (congestive) heart failure: Secondary | ICD-10-CM | POA: Insufficient documentation

## 2017-12-13 DIAGNOSIS — D72829 Elevated white blood cell count, unspecified: Secondary | ICD-10-CM | POA: Insufficient documentation

## 2017-12-13 DIAGNOSIS — Z7189 Other specified counseling: Secondary | ICD-10-CM | POA: Insufficient documentation

## 2017-12-13 DIAGNOSIS — I2510 Atherosclerotic heart disease of native coronary artery without angina pectoris: Secondary | ICD-10-CM | POA: Insufficient documentation

## 2017-12-13 DIAGNOSIS — Z955 Presence of coronary angioplasty implant and graft: Secondary | ICD-10-CM

## 2017-12-13 DIAGNOSIS — I10 Essential (primary) hypertension: Secondary | ICD-10-CM | POA: Insufficient documentation

## 2017-12-13 DIAGNOSIS — J90 Pleural effusion, not elsewhere classified: Secondary | ICD-10-CM | POA: Insufficient documentation

## 2017-12-13 DIAGNOSIS — J449 Chronic obstructive pulmonary disease, unspecified: Secondary | ICD-10-CM | POA: Insufficient documentation

## 2017-12-13 DIAGNOSIS — E785 Hyperlipidemia, unspecified: Secondary | ICD-10-CM | POA: Insufficient documentation

## 2017-12-14 ENCOUNTER — Inpatient Hospital Stay: Payer: PPO | Attending: Hematology | Admitting: Hematology

## 2017-12-14 ENCOUNTER — Inpatient Hospital Stay: Payer: PPO

## 2017-12-15 ENCOUNTER — Other Ambulatory Visit: Payer: Self-pay

## 2017-12-27 DIAGNOSIS — I5022 Chronic systolic (congestive) heart failure: Secondary | ICD-10-CM | POA: Diagnosis not present

## 2017-12-28 ENCOUNTER — Other Ambulatory Visit: Payer: Self-pay | Admitting: *Deleted

## 2017-12-28 NOTE — Patient Outreach (Signed)
Follow up with pt's significant other, Brett Flynn. She reports Brett Flynn is getting services from [facility] now. His CT of head did show mets and they have decided to go with Hospice as his condition was rapidly declining. [name] remains at home at this time.She states Hospice is seeing him weekly. She has sitters to assist her and she says she is well supported too.   I have advised her that I am glad they have decided for the Hospice support. I advised I am keeping them in my thoughts and prayers. [name] voiced her appreciation for our caring and assistance to get the care Amine needs.  Eulah Pont. [name], MSN, [facility] Gerontological Nurse Practitioner [facility] Care Management [phone]

## 2018-01-26 DIAGNOSIS — I5022 Chronic systolic (congestive) heart failure: Secondary | ICD-10-CM | POA: Diagnosis not present

## 2018-02-14 DEATH — deceased
